# Patient Record
Sex: Female | Born: 1940 | Race: White | Hispanic: No | Smoking: Never smoker
Health system: Southern US, Community
[De-identification: ages and names within clinical notes are randomized; demographics above are authoritative.]

## PROBLEM LIST (undated history)

## (undated) DIAGNOSIS — M1990 Unspecified osteoarthritis, unspecified site: Secondary | ICD-10-CM

## (undated) DIAGNOSIS — K3184 Gastroparesis: Secondary | ICD-10-CM

## (undated) DIAGNOSIS — Z90710 Acquired absence of both cervix and uterus: Secondary | ICD-10-CM

## (undated) DIAGNOSIS — G4730 Sleep apnea, unspecified: Secondary | ICD-10-CM

## (undated) DIAGNOSIS — K449 Diaphragmatic hernia without obstruction or gangrene: Secondary | ICD-10-CM

## (undated) DIAGNOSIS — E039 Hypothyroidism, unspecified: Secondary | ICD-10-CM

## (undated) DIAGNOSIS — R0789 Other chest pain: Secondary | ICD-10-CM

## (undated) DIAGNOSIS — Z967 Presence of other bone and tendon implants: Secondary | ICD-10-CM

## (undated) DIAGNOSIS — M542 Cervicalgia: Secondary | ICD-10-CM

## (undated) DIAGNOSIS — IMO0002 Reserved for concepts with insufficient information to code with codable children: Secondary | ICD-10-CM

## (undated) DIAGNOSIS — Z7289 Other problems related to lifestyle: Secondary | ICD-10-CM

## (undated) DIAGNOSIS — K219 Gastro-esophageal reflux disease without esophagitis: Secondary | ICD-10-CM

## (undated) DIAGNOSIS — M549 Dorsalgia, unspecified: Secondary | ICD-10-CM

## (undated) DIAGNOSIS — F329 Major depressive disorder, single episode, unspecified: Secondary | ICD-10-CM

## (undated) DIAGNOSIS — F32A Depression, unspecified: Secondary | ICD-10-CM

## (undated) HISTORY — DX: Gastroparesis: K31.84

## (undated) HISTORY — PX: COLONOSCOPY W/ BIOPSIES AND POLYPECTOMY: SHX1376

## (undated) HISTORY — DX: Major depressive disorder, single episode, unspecified: F32.9

## (undated) HISTORY — PX: NECK SURGERY: SHX720

## (undated) HISTORY — DX: Diaphragmatic hernia without obstruction or gangrene: K44.9

## (undated) HISTORY — DX: Cervicalgia: M54.2

## (undated) HISTORY — DX: Reserved for concepts with insufficient information to code with codable children: IMO0002

## (undated) HISTORY — DX: Other chest pain: R07.89

## (undated) HISTORY — DX: Depression, unspecified: F32.A

## (undated) HISTORY — DX: Gastro-esophageal reflux disease without esophagitis: K21.9

## (undated) HISTORY — DX: Acquired absence of both cervix and uterus: Z90.710

## (undated) HISTORY — DX: Sleep apnea, unspecified: G47.30

## (undated) HISTORY — DX: Other problems related to lifestyle: Z72.89

## (undated) HISTORY — DX: Hypothyroidism, unspecified: E03.9

## (undated) HISTORY — DX: Presence of other bone and tendon implants: Z96.7

## (undated) HISTORY — PX: ABDOMINAL HYSTERECTOMY: SHX81

## (undated) HISTORY — DX: Dorsalgia, unspecified: M54.9

---

## 1998-04-17 ENCOUNTER — Ambulatory Visit (HOSPITAL_COMMUNITY): Admission: RE | Admit: 1998-04-17 | Discharge: 1998-04-17 | Payer: Self-pay | Admitting: Family Medicine

## 1999-03-03 ENCOUNTER — Other Ambulatory Visit: Admission: RE | Admit: 1999-03-03 | Discharge: 1999-03-03 | Payer: Self-pay | Admitting: Obstetrics and Gynecology

## 2000-04-10 ENCOUNTER — Encounter: Admission: RE | Admit: 2000-04-10 | Discharge: 2000-04-10 | Payer: Self-pay | Admitting: Obstetrics and Gynecology

## 2000-04-10 ENCOUNTER — Encounter: Payer: Self-pay | Admitting: Obstetrics and Gynecology

## 2000-05-02 ENCOUNTER — Other Ambulatory Visit: Admission: RE | Admit: 2000-05-02 | Discharge: 2000-05-02 | Payer: Self-pay | Admitting: *Deleted

## 2000-12-04 ENCOUNTER — Ambulatory Visit (HOSPITAL_COMMUNITY): Admission: RE | Admit: 2000-12-04 | Discharge: 2000-12-04 | Payer: Self-pay | Admitting: Gastroenterology

## 2002-01-24 ENCOUNTER — Other Ambulatory Visit: Admission: RE | Admit: 2002-01-24 | Discharge: 2002-01-24 | Payer: Self-pay | Admitting: Obstetrics and Gynecology

## 2002-02-09 ENCOUNTER — Emergency Department (HOSPITAL_COMMUNITY): Admission: EM | Admit: 2002-02-09 | Discharge: 2002-02-09 | Payer: Self-pay | Admitting: Emergency Medicine

## 2002-04-02 ENCOUNTER — Encounter: Admission: RE | Admit: 2002-04-02 | Discharge: 2002-04-02 | Payer: Self-pay | Admitting: Obstetrics and Gynecology

## 2002-04-02 ENCOUNTER — Encounter: Payer: Self-pay | Admitting: Obstetrics and Gynecology

## 2003-01-18 ENCOUNTER — Encounter: Payer: Self-pay | Admitting: Neurosurgery

## 2003-01-18 ENCOUNTER — Ambulatory Visit (HOSPITAL_COMMUNITY): Admission: RE | Admit: 2003-01-18 | Discharge: 2003-01-18 | Payer: Self-pay | Admitting: Neurosurgery

## 2003-01-28 ENCOUNTER — Other Ambulatory Visit: Admission: RE | Admit: 2003-01-28 | Discharge: 2003-01-28 | Payer: Self-pay | Admitting: Obstetrics and Gynecology

## 2003-03-05 ENCOUNTER — Encounter: Payer: Self-pay | Admitting: Neurosurgery

## 2003-03-07 ENCOUNTER — Inpatient Hospital Stay (HOSPITAL_COMMUNITY): Admission: RE | Admit: 2003-03-07 | Discharge: 2003-03-08 | Payer: Self-pay | Admitting: Neurosurgery

## 2003-03-07 ENCOUNTER — Encounter: Payer: Self-pay | Admitting: Neurosurgery

## 2003-04-26 ENCOUNTER — Emergency Department (HOSPITAL_COMMUNITY): Admission: EM | Admit: 2003-04-26 | Discharge: 2003-04-26 | Payer: Self-pay | Admitting: Emergency Medicine

## 2003-05-26 ENCOUNTER — Encounter: Payer: Self-pay | Admitting: Neurosurgery

## 2003-05-26 ENCOUNTER — Encounter: Admission: RE | Admit: 2003-05-26 | Discharge: 2003-05-26 | Payer: Self-pay | Admitting: Neurosurgery

## 2003-07-07 ENCOUNTER — Ambulatory Visit (HOSPITAL_BASED_OUTPATIENT_CLINIC_OR_DEPARTMENT_OTHER): Admission: RE | Admit: 2003-07-07 | Discharge: 2003-07-07 | Payer: Self-pay | Admitting: Family Medicine

## 2004-02-20 ENCOUNTER — Encounter: Admission: RE | Admit: 2004-02-20 | Discharge: 2004-02-20 | Payer: Self-pay | Admitting: Obstetrics and Gynecology

## 2004-10-22 ENCOUNTER — Emergency Department (HOSPITAL_COMMUNITY): Admission: EM | Admit: 2004-10-22 | Discharge: 2004-10-22 | Payer: Self-pay | Admitting: Emergency Medicine

## 2004-10-25 ENCOUNTER — Ambulatory Visit (HOSPITAL_COMMUNITY): Admission: RE | Admit: 2004-10-25 | Discharge: 2004-10-25 | Payer: Self-pay | Admitting: Neurosurgery

## 2004-12-16 ENCOUNTER — Ambulatory Visit: Admission: RE | Admit: 2004-12-16 | Discharge: 2004-12-16 | Payer: Self-pay | Admitting: Family Medicine

## 2005-07-22 ENCOUNTER — Ambulatory Visit (HOSPITAL_COMMUNITY): Admission: RE | Admit: 2005-07-22 | Discharge: 2005-07-22 | Payer: Self-pay | Admitting: Family Medicine

## 2005-07-30 ENCOUNTER — Emergency Department (HOSPITAL_COMMUNITY): Admission: EM | Admit: 2005-07-30 | Discharge: 2005-07-30 | Payer: Self-pay | Admitting: Emergency Medicine

## 2005-09-08 ENCOUNTER — Ambulatory Visit (HOSPITAL_BASED_OUTPATIENT_CLINIC_OR_DEPARTMENT_OTHER): Admission: RE | Admit: 2005-09-08 | Discharge: 2005-09-08 | Payer: Self-pay | Admitting: Family Medicine

## 2005-09-10 ENCOUNTER — Encounter: Payer: Self-pay | Admitting: Emergency Medicine

## 2005-09-10 ENCOUNTER — Inpatient Hospital Stay (HOSPITAL_COMMUNITY): Admission: AD | Admit: 2005-09-10 | Discharge: 2005-09-14 | Payer: Self-pay | Admitting: Internal Medicine

## 2005-09-11 ENCOUNTER — Ambulatory Visit: Payer: Self-pay | Admitting: Internal Medicine

## 2005-09-12 ENCOUNTER — Encounter (INDEPENDENT_AMBULATORY_CARE_PROVIDER_SITE_OTHER): Payer: Self-pay | Admitting: Cardiology

## 2005-10-10 ENCOUNTER — Ambulatory Visit: Payer: Self-pay | Admitting: Gastroenterology

## 2005-11-09 ENCOUNTER — Ambulatory Visit: Payer: Self-pay | Admitting: Gastroenterology

## 2005-11-13 ENCOUNTER — Emergency Department (HOSPITAL_COMMUNITY): Admission: EM | Admit: 2005-11-13 | Discharge: 2005-11-13 | Payer: Self-pay | Admitting: *Deleted

## 2005-11-16 ENCOUNTER — Encounter: Admission: RE | Admit: 2005-11-16 | Discharge: 2005-11-16 | Payer: Self-pay | Admitting: Sports Medicine

## 2005-11-25 ENCOUNTER — Ambulatory Visit (HOSPITAL_COMMUNITY): Admission: RE | Admit: 2005-11-25 | Discharge: 2005-11-25 | Payer: Self-pay | Admitting: Unknown Physician Specialty

## 2006-02-04 IMAGING — RF DG UGI W/ HIGH DENSITY W/KUB
13 of 24 series · 13 of 24 positions shown · non-contrast
Comparison: none

CLINICAL DATA: Dysphagia. Pain in the epigastric region and lower chest, at the
level of the gastroesophageal junction. Atrophic gastritis at recent endoscopy.
Long-term Nexium use.

UPPER GI SERIES W/ HIGH-DENSITY BARIUM AND KUB:
TECHNIQUE: After obtaining a scout radiograph, a double-contrast upper GI
series was performed using both high-density and thin barium.

[Series 1: run · 1 of 1 slices shown (1 of 13)]
[im 1/1]
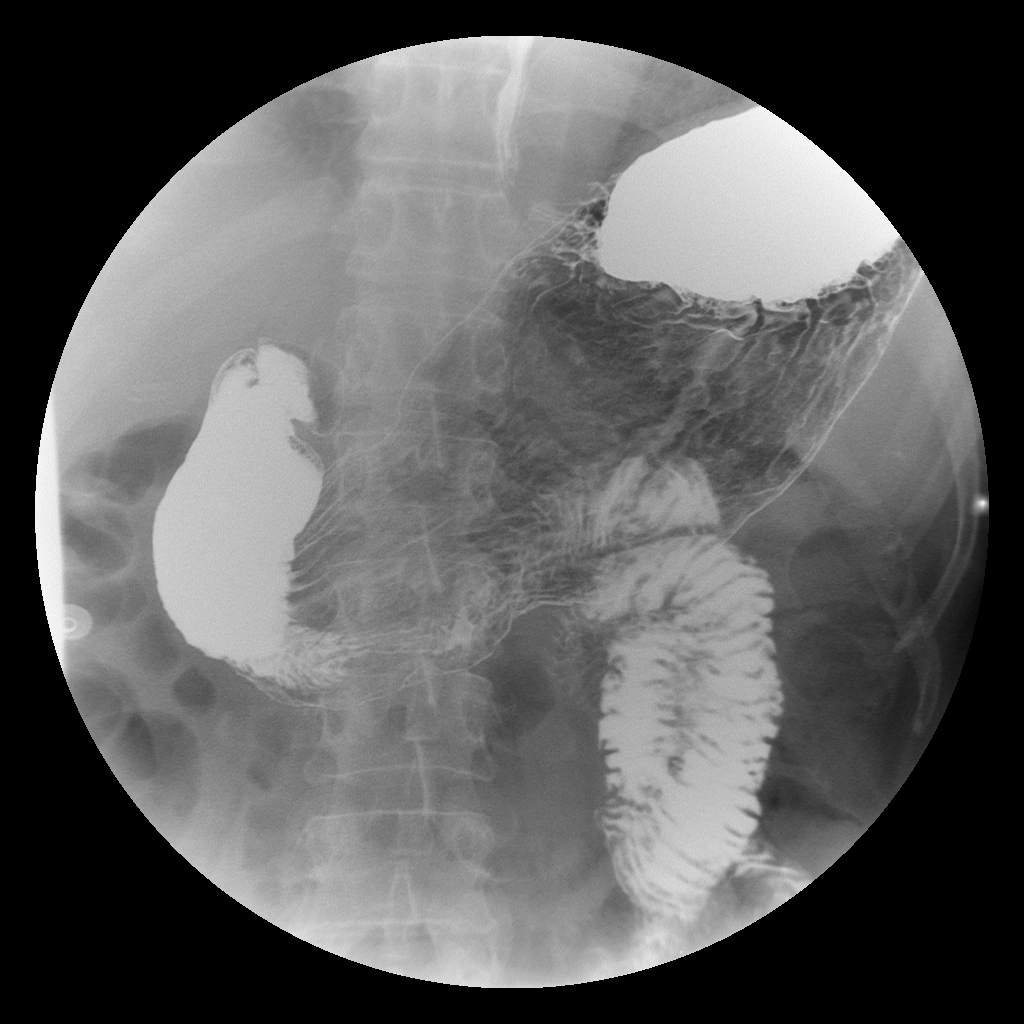

[Series 3: run · 1 of 1 slices shown (2 of 13)]
[im 1/1]
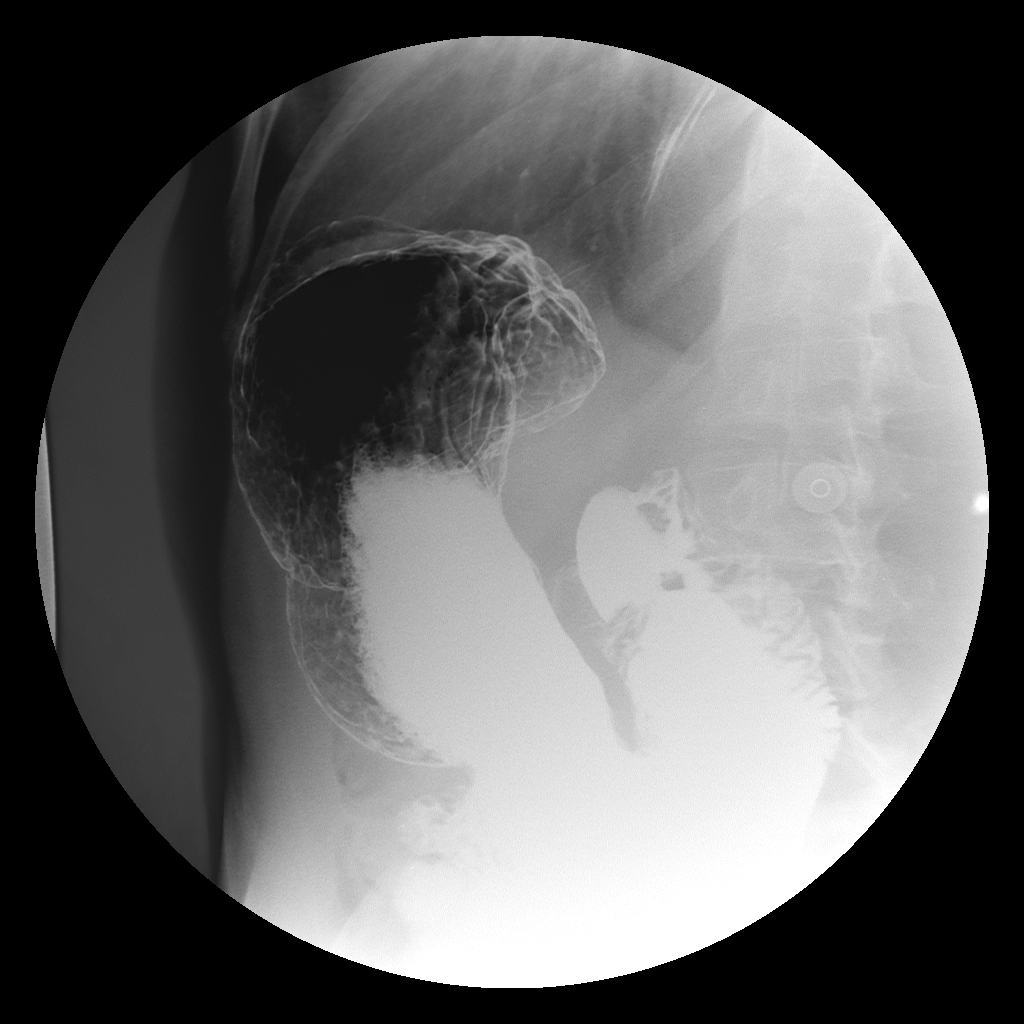

[Series 5: run · 1 of 1 slices shown (3 of 13)]
[im 1/1]
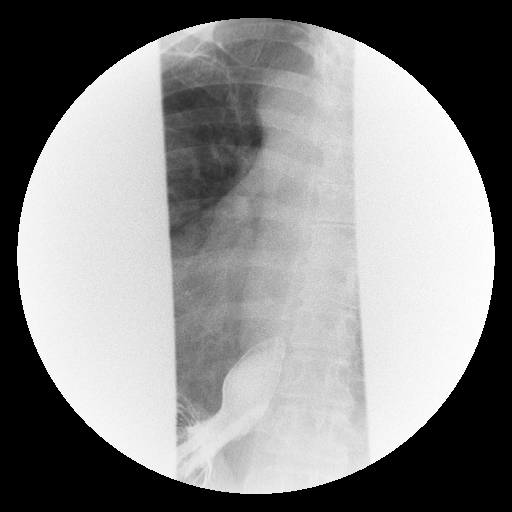

[Series 7: run · 1 of 1 slices shown (4 of 13)]
[im 1/1]
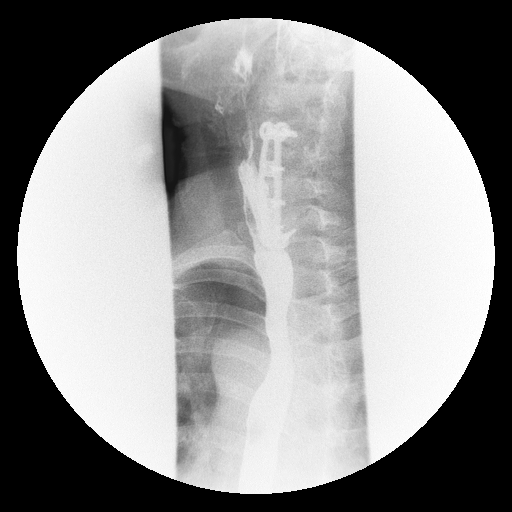

[Series 9: run · 1 of 1 slices shown (5 of 13)]
[im 1/1]
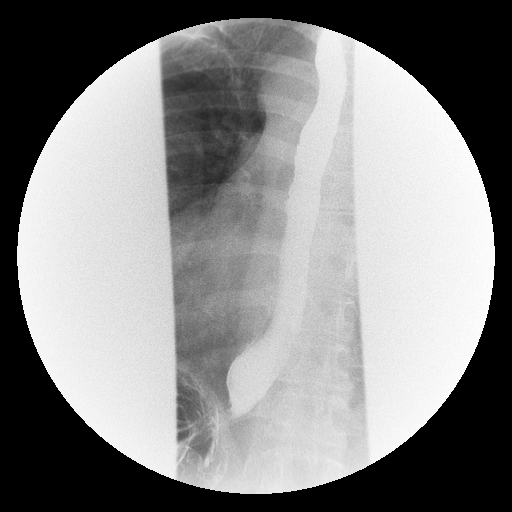

[Series 11: run · 1 of 1 slices shown (6 of 13)]
[im 1/1]
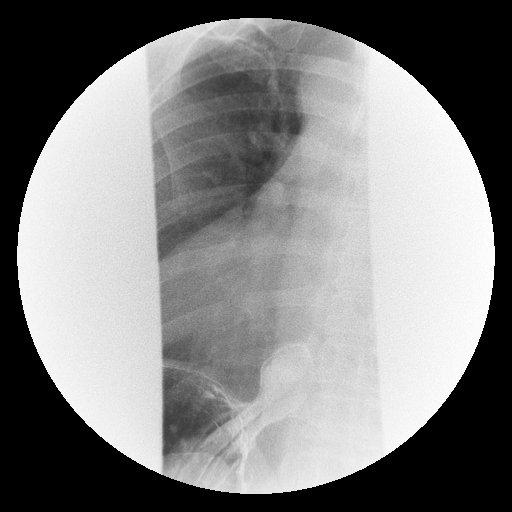

[Series 13: run · 1 of 1 slices shown (7 of 13)]
[im 1/1]
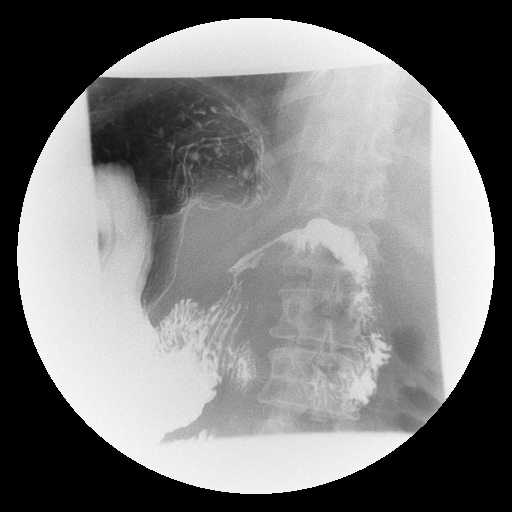

[Series 14: run · 1 of 1 slices shown (8 of 13)]
[im 1/1]
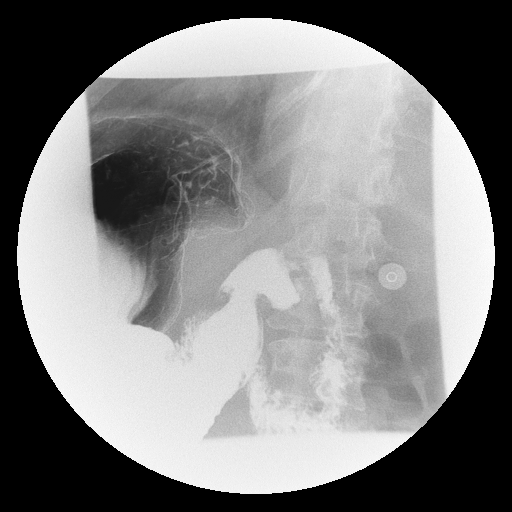

[Series 16: run · 1 of 1 slices shown (9 of 13)]
[im 1/1]
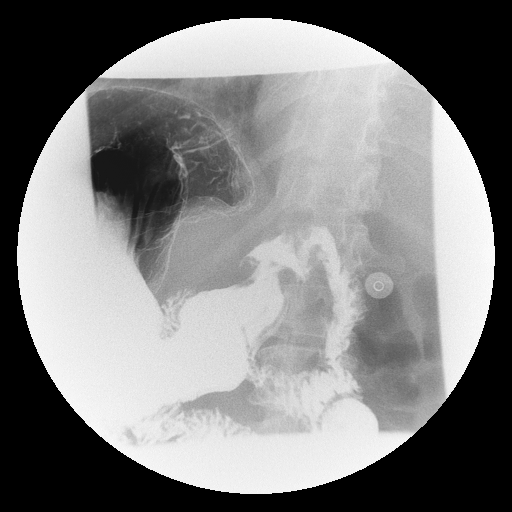

[Series 18: run · 1 of 1 slices shown (10 of 13)]
[im 1/1]
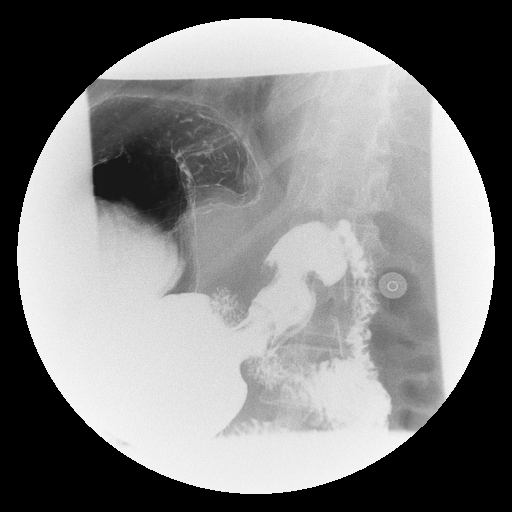

[Series 20: run · 1 of 1 slices shown (11 of 13)]
[im 1/1]
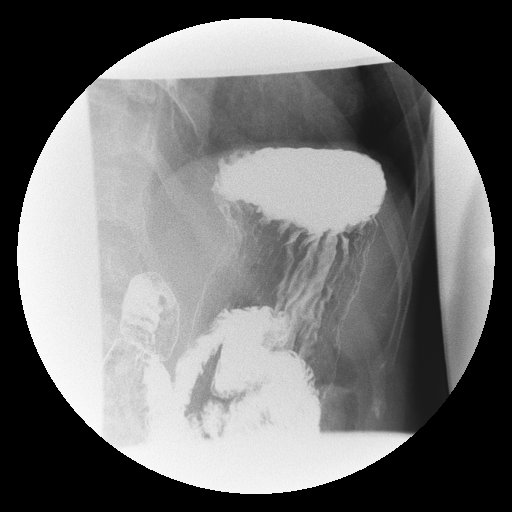

[Series 22: run · 1 of 1 slices shown (12 of 13)]
[im 1/1]
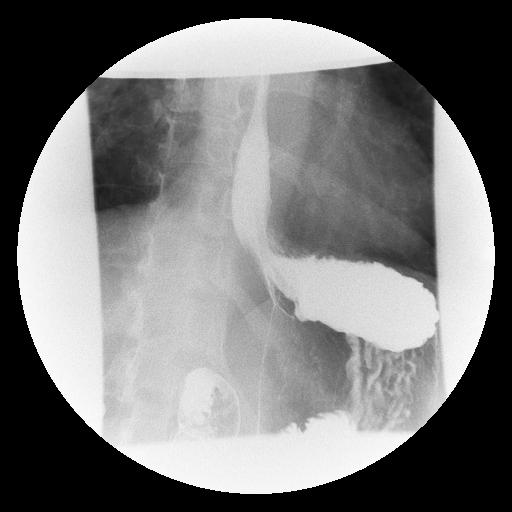

[Series 24: run · 1 of 1 slices shown (13 of 13)]
[im 1/1]
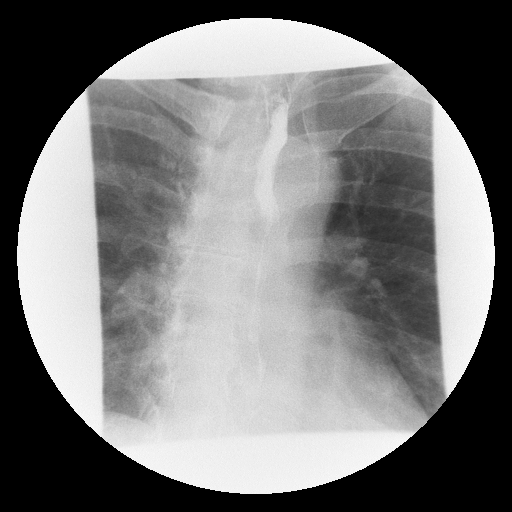

[13 of 24 positions shown; findings below may reference images not displayed]

FINDINGS: The preliminary supine radiograph of the abdomen demonstrates a
normal bowel gas pattern and mild dextro-convex thoracolumbar rotary scoliosis.
Also noted are bilateral pelvic phleboliths and lower lumbar spine facet
degenerative changes.

The patient swallowed barium without difficulty. Normal primary and secondary
esophageal peristalsis with minimal intermittent tertiary peristaltic activity
in the midesophagus. Small sliding hiatal hernia. Otherwise, normal appearing
esophagus, stomach, duodenal bulb and proximal small bowel. Spontaneous
gastroesophageal reflux to the level of the clavicles with slow clearing of the
esophagus following reflux. No strictures, masses or ulcerations seen. No
hypopharyngeal abnormalities demonstrated. Screw and plate fixation of the
cervical spine is noted. The patient swallowed a 12.5 mm in diameter barium
tablet without difficulty. This passed normally through the esophagus and into
the stomach.
IMPRESSION: 1. Small sliding hiatal hernia.

2. Spontaneous gastroesophageal reflux to the level of the clavicles with slow
clearing of the esophagus following reflux.

3. Minimal intermittent tertiary peristaltic activity in the midesophagus.

## 2006-04-18 ENCOUNTER — Inpatient Hospital Stay (HOSPITAL_COMMUNITY): Admission: AD | Admit: 2006-04-18 | Discharge: 2006-04-24 | Payer: Self-pay | Admitting: *Deleted

## 2006-04-18 ENCOUNTER — Ambulatory Visit: Payer: Self-pay | Admitting: *Deleted

## 2006-05-11 ENCOUNTER — Encounter: Admission: RE | Admit: 2006-05-11 | Discharge: 2006-05-11 | Payer: Self-pay | Admitting: Sports Medicine

## 2006-06-05 ENCOUNTER — Encounter: Admission: RE | Admit: 2006-06-05 | Discharge: 2006-06-05 | Payer: Self-pay | Admitting: Sports Medicine

## 2007-06-12 DIAGNOSIS — K2100 Gastro-esophageal reflux disease with esophagitis, without bleeding: Secondary | ICD-10-CM | POA: Insufficient documentation

## 2007-08-17 ENCOUNTER — Encounter: Admission: RE | Admit: 2007-08-17 | Discharge: 2007-08-17 | Payer: Self-pay | Admitting: Family Medicine

## 2007-09-03 ENCOUNTER — Encounter: Admission: RE | Admit: 2007-09-03 | Discharge: 2007-09-03 | Payer: Self-pay | Admitting: Family Medicine

## 2008-08-12 DIAGNOSIS — E039 Hypothyroidism, unspecified: Secondary | ICD-10-CM | POA: Insufficient documentation

## 2008-08-27 ENCOUNTER — Ambulatory Visit: Payer: Self-pay | Admitting: Vascular Surgery

## 2010-09-19 ENCOUNTER — Encounter: Payer: Self-pay | Admitting: Family Medicine

## 2010-09-19 ENCOUNTER — Encounter: Payer: Self-pay | Admitting: *Deleted

## 2010-09-19 ENCOUNTER — Encounter: Payer: Self-pay | Admitting: Sports Medicine

## 2011-01-11 NOTE — Procedures (Signed)
CAROTID DUPLEX EXAM   INDICATION:  Left carotid bruit.   HISTORY:  Diabetes:  No.  Cardiac:  No.  Hypertension:  No.  Smoking:  No.  Previous Surgery:  No.  CV History:  Asymptomatic.  Amaurosis Fugax No, Paresthesias No, Hemiparesis No                                       RIGHT             LEFT  Brachial systolic pressure:         126               128  Brachial Doppler waveforms:         Normal            Normal  Vertebral direction of flow:        Antegrade         Antegrade  DUPLEX VELOCITIES (cm/sec)  CCA peak systolic                   100               86  ECA peak systolic                   65                86  ICA peak systolic                   88                50  ICA end diastolic                   38                21  PLAQUE MORPHOLOGY:                  Heterogenous  PLAQUE AMOUNT:                      Minimal           None  PLAQUE LOCATION:                    ICA/ECA   IMPRESSION:  1. 1-39% stenosis of the right internal carotid artery.  2. No evidence of stenosis noted in the left carotid arteries.  3. Preliminary report was faxed to Dr. Elvis Coil office of 08/27/2008.   ___________________________________________  V. Charlena Cross, MD   CH/MEDQ  D:  08/27/2008  T:  08/27/2008  Job:  [id number]

## 2011-01-14 NOTE — Op Note (Signed)
NAME:  [name], [name]                          ACCOUNT NO.:  [id number]   MEDICAL RECORD NO.:  [id number]                   PATIENT TYPE:  INP   LOCATION:  3005                                 FACILITY:  [facility]   PHYSICIAN:  Payton Doughty, M.D.                   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   OPERATIVE PROCEDURE:  C4-C5, C5-C6, C6-C7 anterior cervical decompression  and fusion with tether plate.   SURGEON:  Payton Doughty, M.D.   ANESTHESIA:  General endotracheal anesthesia.   PREPARATION:  Betadine and alcohol wipe.   COMPLICATIONS:  None.   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  She is taken to the operating room,  general anesthesia administered and intubated, and placed supine on the  operating table.  Following shave, prep, and drape in the usual sterile  fashion, the skin was incised starting 3 fingerbreadths below the carotid  tubercle on the sternocleidomastoid muscle, paralleling the  sternocleidomastoid muscle, cephalad for a distance of approximately 8 cm.  The platysma was identified, elevated, divided, and undermined.  The  sternocleidomastoid muscle was identified.  Medial dissection revealed the  carotid artery retracted laterally to the left, the trachea and esophagus  retracted laterally to the right exposing the bones of the anterior cervical  spine.  A marker was placed and interoperative x-ray was obtained to confirm  correct level.  After confirming correct level, the longus colli muscle was  taken down at C4-C5, C5-C6, and C6-C7 and the self-retaining retractor  placed.   Discectomy  was then carried out under gross observation at C4-C5, C5-C6, and  C6-C7.  The operating microscope was then brought in and we used  microdissection technique to complete the decompression discectomy removing  the posterior longitudinal ligament, explore the neural foramina  bilaterally.  At C4-C5 and C6-C7, there was extensive degenerative change  with prominent osteophytic spurring.  At C5-C6, there is osteophytic  spurring but also a central disc and slightly to the right.  This was  removed without difficulty.  The neural foramen were carefully explored at  all levels and found to be open.  Where there was not an opening, it was  enlarged with a Kerrison punch.  Having achieved decompression, 7 mm bone  grafts were fashioned from patellar allograft and tapped into place.  A 52  mm tethered plate was then placed with two screws in C4, two in C7, one  respectively in C5 and C6.  The screws were all 12 mm.  The  interoperative x-  ray showed good placement of bone grafts, plate and screws.  The wound was  irrigated and hemostasis assured.  The platysma was reapproximated with 2-0  Vicryl in an interrupted fashion, the  subcutaneous tissue reapproximated with 3-0 Vicryl in an interrupted  fashion, and the skin was closed with 4-0 Vicryl in a running subcuticular  fashion.  Benzoin and Steri-Strips were placed with Telfa and OpSite.  The  patient was placed in an Aspen collar and returned to the recovery room in  good condition.                                               Payton Doughty, M.D.    MWR/MEDQ  D:  03/07/2003  T:  03/07/2003  Job:  [id number]

## 2011-01-14 NOTE — Consult Note (Signed)
[name], [name]                ACCOUNT NO.:  [id number]   MEDICAL RECORD NO.:  [id number]          PATIENT TYPE:  INP   LOCATION:  2009                         FACILITY:  [facility]   PHYSICIAN:  Georga Hacking, M.D.DATE OF BIRTH:  [birth year]   DATE OF CONSULTATION:  DATE OF DISCHARGE:                                   CONSULTATION   HISTORY OF PRESENT ILLNESS:  Thank you for asking me to see this 70 year old  female for evaluation of chest discomfort, chest pressure, PND, throat  tightness and numbness.  The patient is a 70 year old female who has  previously been in good health.  She has a 2-year history of sleep apnea and  also had cervical disk disease requiring a multi-level laminectomy at that  time.  She had hypothyroidism and has had some fatigue.  She has a history  of severe reflux previously.  Around Thanksgiving in November she had an  upper respiratory infection treated with antibiotics and steroids.  She had  cough during that time but since that time has had a feeling of chest  pressure and squeezing-type discomfort.  She has had symptoms though due to  squeezing related to reflux, feels this is different.  She has had a  constant pressure in her mid-epigastric area and mid-sternal area for 2  weeks.  It would be worse when she lay down, and also in addition has had a  sensation of her throat closing and feeling as if somebody had a band around  her throat.  She has been unable to sleep at night and has seen Dr. Tiburcio Pea  for this.  In addition, she has had numbness around her face and has had  numbness in both of her legs she has also seen Dr. Tiburcio Pea for.  She had a  sleep study on Thursday night and had an event noted there where she had  some squeezing pressure and felt as if she might stop breathing.  She has  been awakened from sleep with pressure and squeezing and has had no relief  despite titration of Zantac to Prilosec and __________.  She has complained  of  feeling excessively fatigued.  She has not had palpitations or syncope.  She had some transient blurred vision earlier today and was seen at the  hospital, where an EKG was unremarkable.   LABORATORY DATA:  Clearly much unremarkable.   PAST MEDICAL HISTORY:  Remarkable for a previous history of hypothyroidism,  sleep apnea, and severe reflux.  There is no history of diabetes,  hyperlipidemia, or hypertension.  Previous surgery:  Hysterectomy and multi-  level cervical laminectomy.   ALLERGIES:  PENICILLIN.   CURRENT MEDICATIONS:  Zegerid one daily, Omothyroid daily, Estrace.   FAMILY HISTORY:  Mother died of Alzheimer's disease at age 74, father died  of old age at age 58.  She has 2 sisters who are in good health and a  brother who is healthy.   SOCIAL HISTORY:  She has been divorced.  She has a daughter that she  currently lives with.  She attends church at [employer].  She is  a nonsmoker and does not use alcohol to excess.   REVIEW OF SYSTEMS:  Notes an 8-pound weight loss and excessive fatigue.  She  has been told that she has early glaucoma and may have early cataracts.  She  has no difficulty hearing.  She has no difficulty with her sinuses or her  mouth.  She has significant reflux but has never been checked for  gallbladder disease, no constipation or diarrhea.  She has no kidney stones,  urinary tract infection or urinary complaints.  She thinks she has some  early arthritis in her hip.  She has dry skin.  She has a previous history  of hypothyroidism.  She has chronic low back pain and also may have  occasional headaches.  No history of CVA or TIA.  Other than as noted above,  the remainder of the review of systems is unremarkable.   PHYSICAL EXAMINATION:  On examination she is a pleasant female, appearing  stated age.  Blood pressure is currently 115/79, pulse 60 and regular.  Respirations were 12.  She is pleasant and in no acute distress.  Her skin  is  warm and dry with no obvious mass or lesions.  ENT:  She wears glasses.  EOMI, PERRL.  Sclerae are clear.  Fundi not  examined.  Pharynx negative.  NECK:  Supple, without masses.  There is no JVD, thyromegaly, or bruits.  Her neck is fused.  LUNGS:  Clear bilaterally.  CARDIOVASCULAR:  Normal S1, S2.  There is no S3, S4 or murmur.  ABDOMEN:  Soft and nontender.  There is no right upper quadrant tenderness  and no rebound.  EXTREMITIES:  Her femoral and distal pulses are 2+.  There is no peripheral  edema noted.  There is no Homan's sign noted.  GU and RECTAL were deferred due to cardiac condition.  NEUROLOGIC:  Grossly normal.   Her 12-lead EKG shows normal sinus rhythm.  There is voltage criteria for  LDH.  Chest x-ray shows clear lung fields.  Heart size is normal.  The ascending  aorta appears somewhat prominent.   LABORATORY DATA:  Normal CBC and normal chemistry panel.  Initial point of  care enzymes were completely normal.  Liver enzymes were normal.   IMPRESSION:  1.  Vague subacute history of chest pressure tightness, throat tightness,      which has atypical features to it.  She has had significant chest      squeezing and pressure occurring more at rest than with exertion.  It      could be an atypical presentation of angina or could be non-cardiac.      She had an emergency room visit in December also for similar complaints.  2.  History of severe reflux, unresponsive to current medicines.  3.  History of hypothyroidism under treatment.  4.  History of cervical laminectomy.  5.  Perioral numbness and leg numbness of uncertain cause.  6.  Fatigue and malaise of uncertain cause.  7.  History of sleep apnea treated with C-PAP.   RECOMMENDATIONS:  Very difficult history to sort out what this is.  She has  had 2 emergency room visits within the past month.  She is admitted to a telemetry bed and will be placed on anticoagulation, low-dose beta blockers  and serial enzymes  will be obtained.  Initial enzymes are negative.  An EKG  is normal.   I would recommend that she have  a gallbladder ultrasound test and if this is  negative it may be that a cardiac catheterization would best help sort out  the symptoms that she is having.  She has had 2 emergency room visits and  her pain is at rest, although she does not have any significant risk factors  for cardiac disease.  I will follow along with you and make final  recommendations as to the source of diagnostic testing.      Georga Hacking, M.D.  Electronically Signed     WST/MEDQ  D:  09/10/2005  T:  09/11/2005  Job:  [id number]   cc:   Melissa L. Ladona Ridgel, MD   Holley Bouche, M.D.  Fax: [phone]

## 2011-01-14 NOTE — H&P (Signed)
NAME:  [name], [name]                          ACCOUNT NO.:  [id number]   MEDICAL RECORD NO.:  [id number]                   PATIENT TYPE:  INP   LOCATION:  3005                                 FACILITY:  [facility]   PHYSICIAN:  Payton Doughty, M.D.                   DATE OF BIRTH:  1941-04-27   DATE OF ADMISSION:  03/07/2003  DATE OF DISCHARGE:                                HISTORY & PHYSICAL   ADMISSION DIAGNOSIS:  Cervical spondylosis at C4-5, C5-6, C6-7.   HISTORY OF PRESENT ILLNESS:  A very nice 70 year old right-handed white lady  with an injury to her ______ when she fell roller skating. Visited with Dr.  Elesa Hacker, had some C4-5 disease.  She visits with a chiropractor.  I saw her  almost a year ago with neck pain, and she had spondylosis at that time,  underwent epidural steroids which did not seem to help her very much.  She  has had progression of spondylitic disease at 4-5, 5-6, and 6-7, and is now  admitted for anterior cervical diskectomy and fusion.   PAST MEDICAL HISTORY:  Remarkable for hypothyroidism   MEDICATIONS:  Paxil and estrogen replacement, Armor thyroid, Vicodin,  Robaxin, and Celebrex.   ALLERGIES:  PENICILLIN.   PAST SURGICAL HISTORY:  Hysterectomy.   FAMILY HISTORY:  Mom died at 85 with Alzheimer's disease.  Dad died at 66  with hypertension and glaucoma.   SOCIAL HISTORY:  She does smoke or drink.  Retired Management consultant.   REVIEW OF SYSTEMS:  Remarkable for neck pain, occasional difficulty  breathing, glasses, and hormone difficulties.   PHYSICAL EXAMINATION:  HEENT:  Within normal limits.  NECK:  She has reasonable range of motion of the neck.  CHEST:  Clear.  CARDIAC:  Regular rate and rhythm without murmur.  ABDOMEN:  Nontender without hepatosplenomegaly.  EXTREMITIES:  Without clubbing or cyanosis.  GU:  Exam deferred.  PULSES:  Peripheral pulses are good.  NEUROLOGIC:  She is awake, alert, and oriented.  Cranial nerves are intact.  Motor exam  shows 5/5 strength throughout the upper extremities save for  bilateral weakness of the triceps at 5-/5.  She has radicular pain with head  motion. Sensory deficit described in C6 and C7 distribution.  Reflexes are 1  at the biceps, absent at the triceps, 1 at the brachialis.  She has mild and  positive Hoffmann's on the right side.   LABORATORY DATA:  MRI demonstrates spondylitic disease at 4-5, 5-6, and 6-7,  replacement of cord and new disk at 5-6.    PLAN:  Anterior cervicectomy and fusion at C4-5, C5-6, and C6-7.  The risks  and benefits of this approach have been discussed with her, and she wishes  to proceed.  Payton Doughty, M.D.    MWR/MEDQ  D:  03/07/2003  T:  03/07/2003  Job:  [id number]

## 2011-01-14 NOTE — Consult Note (Signed)
NAMEJAYCI, ELLEFSON                ACCOUNT NO.:  [id number]   MEDICAL RECORD NO.:  [id number]          PATIENT TYPE:  INP   LOCATION:  2009                         FACILITY:  [facility]   PHYSICIAN:  Shirley Friar, MDDATE OF BIRTH:  [birth year]   DATE OF CONSULTATION:  09/13/2005  DATE OF DISCHARGE:                                   CONSULTATION   REASON FOR CONSULT:  Dysphagia.   HISTORY OF PRESENT ILLNESS:  This consult was completed at the request of  Dr. Jasmine Pang in regards to the patient's dysphagia and chest pain. The  patient is a 70 year old white female who was admitted on September 10, 2005,  secondary to substernal chest pain. She ruled out for MI and underwent a  cardiac catheterization on September 12, 2005, which was normal. She reports  having chest pain daily for the past several weeks that worsened last week  with associated neck pressure. She states that the neck pressure and chest  pain always occur simultaneously and she cannot pinpoint anything that makes  it better. She note that lying down makes it worse. She has associated  shortness of breath, diaphoresis, and has felt like I was going to die.  She denies any palpitations. She denies any problems swallowing her food.  Does report having a choking feeling when she is not eating when this chest  pain is present as well. She denies any associated abdominal pain. She does  report heartburn chronically. She last saw my partner, Dr. Randa Evens, on  September 02, 2005, who gave her a trial of Zegerid for her reflux. She says  the Zegerid did not help at all with her problems and reports being on  Prevacid in the past without any significant relief. She had an upper  endoscopy done in June 2005 which showed erosive esophagitis and a small  hiatal hernia but otherwise was negative.   PAST MEDICAL HISTORY:  1.  History of GERD.  2.  Hypothyroidism.  3.  History of hiatal hernia.  4.  Sleep apnea.  5.  Status post  hysterectomy.  6.  Status post cervical discectomy.   FAMILY HISTORY:  Noncontributory.   SOCIAL HISTORY:  Denies tobacco or alcohol.   CURRENT MEDICINES:  1.  Xanax 1 mg q.h.s.  2.  Lovenox 40 mg subcu daily.  3.  Pepcid 20 mg b.i.d.  4.  Prilosec 40 mg daily.  5.  Armor Thyroid 30 mg daily.   ALLERGIES:  PENICILLIN and CELEBREX.   REVIEW OF SYSTEMS:  As stated in the HPI.   PHYSICAL EXAMINATION:  VITAL SIGNS:  Temperature 97.7, pulse 60, blood  pressure 117/58, O2 saturation 96% on room air.  GENERAL:  Alert, in no acute distress.  HEENT:  Nonicteric sclerae, oropharynx clear.  CHEST:  Clear to auscultation bilaterally.  CARDIOVASCULAR:  Regular rhythm without murmurs.  ABDOMEN:  Soft, nontender, nondistended, active bowel sounds.  EXTREMITIES:  No edema.   ASSESSMENT:  A 70 year old white female with gastroesophageal reflux disease  and history of hiatal hernia presenting with chest pressure and neck  pressure. My  concern is that she could have esophageal dysmotility along  with a larger hiatal hernia versus worsened esophageal reflux as the source  of her symptoms. Will plan to do upper endoscopy today to further evaluate  for ulcerative disease. She had a gastric emptying study done today and the  results are pending. She may benefit from a trial of Reglan versus Elavil  but await these two studies prior to ordering this medicine. She should  continue on proton pump inhibitor daily.      Shirley Friar, MD  Electronically Signed     VCS/MEDQ  D:  09/13/2005  T:  09/13/2005  Job:  [id number]   cc:   Fayrene Fearing L. Malon Kindle., M.D.  Fax: [phone]   Theone Stanley, MD

## 2011-01-14 NOTE — Discharge Summary (Signed)
[name], [name]                ACCOUNT NO.:  [id number]   MEDICAL RECORD NO.:  [id number]          PATIENT TYPE:  IPS   LOCATION:  0302                          FACILITY:  [facility]   PHYSICIAN:  Jasmine Pang, M.D. DATE OF BIRTH:  02-Jul-1941   DATE OF ADMISSION:  04/18/2006  DATE OF DISCHARGE:  04/24/2006                                 DISCHARGE SUMMARY   IDENTIFYING INFORMATION:  This is a 70 year old divorced Caucasian female  who was admitted on a voluntary basis on April 18, 2006.   HISTORY OF PRESENT ILLNESS:  The patient presents with a history of self-  inflicted injury.  She cut her right wrist superficially with a razor blade  a few days prior to admission.  She states that her intention was to die.  She reports a history of chronic pain.  She denies any recent stressors or  changes in her life.  The patient states that she stopped driving one month  ago.  She states she had been losing weight but denies any change in her  appetite.  She has not been sleeping well.  She denies hallucinations or  anxiety and paranoid ideation and feels safe in this facility.   PAST PSYCHIATRIC HISTORY:  This is the patient's first psychiatric  admission.  She has had no outpatient treatment.   PHYSICAL EXAMINATION:  An elderly female in no acute distress.  She was  lying in bed flat on her back, hardly moving due to history of neck pain and  back pain.   LABORATORY DATA:  CBC was within normal limits.  Potassium 3.4.  Urinalysis  too numerous to count with white blood cells.  TSH 0.824.  Urine drug screen  negative.  RPR negative.   HOSPITAL COURSE:  The patient was started on Ambien CR 12.5 mg p.o. q.h.s.  p.r.n., metoclopramide 10 mg p.o. q.d., Paxil CR 25 mg p.o. q.d., Synthroid  0.075 mg p.o. q.d., Wellbutrin XL 150 mg p.o. q.d., hydrocodone 5/325 mg, 1-  2 p.o. q.4-6h. p.r.n.  On April 19, 2006, the patient was started on Cipro  250 mg p.o. b.i.d. x3 days for a urinary tract  infection.  Ambien was  discontinued.  She was placed on trazodone 50 mg p.o. q.h.s. p.r.n.  An MRI  of the brain was initially ordered but this had been done since the patient  has a copy of results from December 06, 2005.  On April 22, 2006, the  patient's trazodone was increased to 75 mg p.o. q.h.s.   On first meeting with the patient, she stated she did not get any sleep the  night before.  She stated the pains were too bad to fall asleep.  She has  pain in her lumbar back as well as her cervical back.  She has talked about  living with her daughter and son.  She has no problems there but says she  stays in bed all the time.  On April 21, 2006, she is feeling better and  stating she wanted to sign out.  I advised that we will probably  discharge  her on Monday, April 24, 2006.  Daughter came to visit.  She stated that  her mother's doctor decided to cut down her pain medications and it was  subsequent to this maneuver that she saw her mother's mental status worsen.  The patient continued to be cooperative but still nonspontaneous.  The  patient states that she has had difficulty sleeping.  The trazodone was  increased to 75 mg p.o. q.a.m.  She continued to feel better through the  hospitalization.   On April 23, 2006, the patient's daughter, the patient's brother and the  patient's pastor attended the family session.  Family discussed concerns of  pain management and physical concerns.  They minimized the patient's  suicidal or homicidal ideation.  The patient stated she was not having  suicidal or homicidal ideation and no longer wanted to harm herself.  The  patient expressed concerns around having medications managed.  A referral to  a doctor for medication management needs to made.  The family identify  several supports and caretakers for the patient when she returns home and  stated she will not be alone.  On April 24, 2006, the patient's mental  status had improved.  Her mood  was less depressed and anxious.  Affect was  wider range.  She was not suicidal or homicidal.  She had no auditory or  visual hallucinations.  No delusions or paranoia.  Thoughts were logical and  goal directed.  She still somewhat nonspontaneous but improved from  admission status.  Cognitive was grossly back to baseline.   DISCHARGE DIAGNOSES:  AXIS I:  Major depressive disorder, recurrent, severe.  AXIS II:  None.  AXIS III:  Chronic pain, status post UPI, hypothyroidism.  AXIS IV:  Moderate (medical problems).  AXIS V:  GAF upon discharge 35; GAF upon admission 25; GAF highest past year  65.   ACTIVITY/DIET:  She had no specific activity level or dietary restrictions  other than she needed to walk carefully due to her back pain.   DISCHARGE MEDICATIONS:  1. Reglan 10 mg q.d.  2. Paxil CR 25 mg tablets daily.  3. Levothyroxine 75 mcg daily.  4. Wellbutrin HCL 300 mg daily.  5. Trazodone 75 mg q.h.s.  6. Lorcet as directed.   POST-HOSPITAL CARE PLANS:  The patient will be seen at the [facility] by Donnie Aho on May 10, 2006 at 2 p.m.  The  family also wants to get her into a new pain clinic, possibly at [facility].      Jasmine Pang, M.D.  Electronically Signed     BHS/MEDQ  D:  04/24/2006  T:  04/25/2006  Job:  [id number]

## 2011-01-14 NOTE — Op Note (Signed)
[name], [name]                ACCOUNT NO.:  [id number]   MEDICAL RECORD NO.:  [id number]          PATIENT TYPE:  INP   LOCATION:  2009                         FACILITY:  [facility]   PHYSICIAN:  Shirley Friar, MDDATE OF BIRTH:  October 17, 1940   DATE OF PROCEDURE:  09/13/2005  DATE OF DISCHARGE:                                 OPERATIVE REPORT   INDICATIONS FOR PROCEDURE:  Chest pain, dysphagia, and abdominal pain.   MEDICATIONS:  1.  Fentanyl 50 mcg IV.  2.  Versed 7 mg IV.   FINDINGS:  1.  Atrophic gastritis.  2.  Small hiatal hernia.   DESCRIPTION OF PROCEDURE:  The endoscope was inserted into the oropharynx  and esophagus was intubated with the GEJ approximately 40 cm from the  incisors.  The endoscope was advanced down into the stomach which had  diffuse atrophic gastritis appearance.  No ulcers or mucosal lesions were  noted.  Retroflexion revealed a small hiatal hernia, otherwise normal  cardia, fundus, and angularis.  Endoscope was straightened and advanced down  into the duodenum bulb which was normal in appearance, as was the second  portion of the duodenum.  The endoscope was withdrawn and compared the above  findings.   ASSESSMENT:  1.  Atrophic gastritis.  2.  Hiatal hernia (small), otherwise normal EGD.   PLAN:  1.  Barium swallow plus upper gastrointestinal series to rule out esophageal      dysmotility.  2.  Check H. pylori serology and treat if positive.  3.  Consider trial of Reglan but await gastric emptying study.  4.  Low fat, small, frequent meals.      Shirley Friar, MD  Electronically Signed    VCS/MEDQ  D:  09/13/2005  T:  09/13/2005  Job:  [id number]   cc:   Fayrene Fearing L. Malon Kindle., M.D.  Fax: [phone]   Theone Stanley, MD

## 2011-01-14 NOTE — Procedures (Signed)
[name], [name]                ACCOUNT NO.:  [id number]   MEDICAL RECORD NO.:  [id number]          PATIENT TYPE:  OUT   LOCATION:  SLEEP CENTER                 FACILITY:  [facility]   PHYSICIAN:  Clinton D. Maple Hudson, M.D. DATE OF BIRTH:  07-23-1941   DATE OF STUDY:  09/10/2005                              NOCTURNAL POLYSOMNOGRAM   REFERRING PHYSICIAN:  Dr. Deatra James.   DATE OF STUDY:  September 08, 2005.   INDICATION FOR STUDY:  Hypersomnia with sleep apnea. Complaints of numbness  around mouth and legs at night. A baseline diagnostic NPSG on July 07, 2003 had reported an AHI of 18 per hour. She did not tolerate C-PAP trial on  that study night but subsequently has been fitted and has been wearing C-PAP  at 9 CWP with a nasal mask. Split study protocol was requested.   SLEEP ARCHITECTURE:  Total sleep time 392 minutes with sleep efficiency 78%.  Stage I was 2%, stage II 77%, stages III and IV 2%, REM 19% of total sleep  time. Sleep latency 45 minutes, REM latency 128 minutes, awake after sleep  onset 62 minutes, arousal index 7.2. An over-the-counter sleep medication  was taken at 9:30 p.m.   RESPIRATORY DATA:  Split study protocol:  Apnea/hypopnea index (AHI, RDI)  3.2 obstructive events per hour which is within normal limits (0/5 per  hour). This included 13 obstructive apneas and 8 hypopneas. She slept  supine. REM AHI 0.8 per hour. There were insufficient events to permit use  of C-PAP titration by split protocol on this study night.   OXYGEN DATA:  Moderate to loud intermittent snoring with oxygen desaturation  to a nadir of 88%, briefly. Mean oxygen saturation through the study was 96%  on room air.   CARDIAC DATA:  Normal sinus rhythm.   MOVEMENT/PARASOMNIA:  Occasional limb jerk with no effect on sleep.   IMPRESSION/RECOMMENDATIONS:  1.  Occasional sleep disorder breathing events within normal limits (AHI 3.2      per hour). She slept primarily on her back after  taking an over-the-      counter sleep medicine.  2.  Moderate snoring with oxygen desaturation to a nadir of 88% and mean      oxygen saturation on room air of 96% through the study.  3.  The study does not document the need for ongoing C-PAP use but her      snoring does suggest some degree of upper airway congestion or      obstruction. Consider treatment directed at that would be helpful. C-PAP      in this context is not likely to be harmful unless it is contributing to      her paresthesias (numbness). Oxygen desaturation events were very      transient and not likely significantly impacted by home C-PAP. It is      possible      that on some occasions she has more significant sleep apnea or upper      airway resistance syndrome (subclinical apnea patterns with arousal, not      meeting scoring rules for formal apneas and hypopneas).  Clinton D. Maple Hudson, M.D.  Diplomate, Biomedical engineer of Sleep Medicine  Electronically Signed     CDY/MEDQ  D:  09/11/2005 14:08:58  T:  09/12/2005 00:20:32  Job:  [id number]

## 2011-01-14 NOTE — H&P (Signed)
[name], [name]                ACCOUNT NO.:  [id number]   MEDICAL RECORD NO.:  [id number]          PATIENT TYPE:  OUT   LOCATION:  SLEE                         FACILITY:  [facility]   PHYSICIAN:  Melissa L. Ladona Ridgel, MD  DATE OF BIRTH:  09/29/1940   DATE OF ADMISSION:  DATE OF DISCHARGE:                                HISTORY & PHYSICAL   CHIEF COMPLAINT:  Chest pain.   PRIMARY CARE PHYSICIAN:  Holley Bouche, M.D.   HISTORY OF PRESENT ILLNESS:  The patient is 70 year old white female with  symptoms of upper respiratory infection treated around Thanksgiving with  steroids and antibiotics.  She states the cough resolved but she continued  to have intensification of a type of chest pain that she has been having for  the past two years.  She describes the chest pain as squeezing in nature,  usually 8/10.  It has awakened her from sleep and is associated with  sensation of being choked and losing her breath.  It travels up into her  neck.  She has no nausea, vomiting or diaphoresis related to this.  The  patient states that for the past two years, the chest pain has been treated  as if it is reflux and has very little success in controlling the symptoms.  There is no evidence for exacerbation with exertion or any relief with any  of the medications that are currently being given to her for reflux.  She  states that last night  she felt as if she was going to stop breathing and  woke up from sleep with this sensation.  She describes the pressure in her  chest as pretty severe.  She states that the night before, she was doing a  sleep study in the sleep lab and experienced the same sensation.  Correlating with that sensation was report from the staff that she had some  sort of a ominous event.  At current, the patient has a sensation of  pressure in her chest.  She describes it as 8/10 which is down to 9/10 from  admission where she was given nitroglycerin.  Pressure is worse at night and  more continuous.  The patient has had excessive work-up for her GI issues  and this afternoon, after she contacted pulmonology about her symptoms, she  was sent to the emergency room thinking that this was related to her cardiac  status.  The patient reports recent excessive fatigue.   REVIEW OF SYSTEMS:  Further review of systems:  Some blurred vision on  arrival at the emergency room relieved with the oxygen. She has had some  weight loss but cannot quantify it.  Some early satiety.  No nausea, no  vomiting, no dysuria, no melena.  All other review of systems are negative.   PAST MEDICAL HISTORY:  1.  Sleep apnea on CPAP for two years.  2.  GERD.  3.  Hypothyroidism.  4.  Hiatal hernia.  5.  She describes no diabetes and no hypertension.  6.  She also complains of neuropathic sensation in both hands and feet.  PAST SURGICAL HISTORY:  1.  Hysterectomy secondary to heavy bleeding.  2.  Diskectomy in the cervical region requiring plating.   SOCIAL HISTORY:  She denies tobacco or ethanol. She was a Runner, broadcasting/film/video of  kindergarten and preschool level.   FAMILY HISTORY:  Mom was deceased with Alzheimer's.  Dad is deceased with  hypertension.   ALLERGIES:  1.  PENICILLIN.  2.  She cannot take NEXIUM or PROTONIX which worsens her symptoms of      numbness in her hands and feet.   MEDICATIONS:  1.  Armour Thyroid 60 mg alternating with 30 mg daily.  2.  Estrace 2.5 mg daily.  3.  She takes Zegerid 40/1100 once daily which is new for her for one week.   PHYSICAL EXAMINATION:  VITAL SIGNS:  Temperature 97.9, blood pressure  115/50, pulse 60, respiratory rate 20, oxygen saturation 100%.  GENERAL:  She is in no acute distress.  HEENT:  Normocephalic, atraumatic.  Pupils are equal, round and reactive to  light.  Extraocular muscles were intact.  Funduscopic exam is within normal  limits.  There is no AV nicking.  No cotton-wool spots.  The posterior wall  of her oropharynx is nearly  obliterated by her soft palate.  NECK:  Supple. There are prominent jugular venous pulses.  CHEST:  Clear to auscultation.  No rhonchi, rales or wheezes.  CARDIOVASCULAR:  Regular rate and rhythm.  Positive S1 and S2.  No S3 or S4.  No murmurs, rubs or gallops.  ABDOMEN:  Soft, nontender, nondistended with positive bowel sounds.  Sensation of fullness in the epigastrium.  EXTREMITIES:  Showed no clubbing, cyanosis or edema. She has perceived  numbness in her feet extending up to the ankles bilaterally and somewhat in  her hands.  NEUROLOGIC:  As stated, funduscopic exam is within normal limits.  Cranial  nerves II-XII intact.  Power is 5/5.  Deep tendon reflexes are 2+.  Plantars  are downgoing.  Sensation appears to be intact.   LABORATORY DATA:  White count is 4.1, hemoglobin 12.4, hematocrit 36,  platelets 111.  Sodium 141, potassium 2.7, chloride 108, CO2 30, BUN 13,  creatinine 0.8, glucose 94.  LFTs within normal limits.  Point-of-care  enzymes are negative x2.   ASSESSMENT/PLAN:  This is a 70 year old white female being treated for two  years for gastroesophageal reflux disease as a source for chest discomfort.  She recently noted worsening sensation of chest pressure which awakens her  at night with the sensation of choking and pressure in her neck.  It has  escalated over the past one month.   1.  Cardiovascular:  Rule out myocardial infarction.  Will start her on      aspirin, Lovenox q.12h.  Continue omeprazole only but  hold the Zegerid      and try the low-dose Lopressor.  Cardiology has been consulted.  Will      obtain a 2-D echo and check a D-dimer to rule out chronic pulmonary      edema.  2.  Pulmonary:  She will use her CPAP on her home setting and, as stated, I      would like to visit the possibility for chronic occult pulmonary      embolus.  3.  Gastrointestinal:  She has a hiatal hernia and gastroesophageal reflux     disease.  Will treat her with omeprazole  and hold the Zegerid.  4.  Genitourinary:  Will check a urinalysis, culture and  sensitivity.  5.  Endocrine:  She is hypothyroid. Will continue her Armour Thyroid and      check a TSH.  6.  Deep venous thrombosis prophylaxis:  Will use Lovenox.  7.  Neurologic:  She has a sensation of neuropathy.  I would like to check a      B12, folate, thyroid studies, and beta-2 microglobulin.      Melissa L. Ladona Ridgel, MD  Electronically Signed     MLT/MEDQ  D:  09/10/2005  T:  09/11/2005  Job:  [id number]   cc:   Holley Bouche, M.D.  Fax: [phone]

## 2011-01-14 NOTE — Discharge Summary (Signed)
[name], [name]                ACCOUNT NO.:  [id number]   MEDICAL RECORD NO.:  [id number]          PATIENT TYPE:  INP   LOCATION:  2009                         FACILITY:  [facility]   PHYSICIAN:  Theone Stanley, MD   DATE OF BIRTH:  [birth year]   DATE OF ADMISSION:  09/10/2005  DATE OF DISCHARGE:                                 DISCHARGE SUMMARY   ADMITTING DIAGNOSES:  1.  Atypical chest pain.  2.  Hypothyroidism.  3.  Gastroesophageal reflux disease.  4.  Hiatal hernia.  5.  Sleep apnea.   DISCHARGE DIAGNOSES:  1.  Gastroparesis.  2.  Evidence of gastroesophageal reflux.  3.  Hypothyroidism.  4.  Sleep apnea.  5.  Chest pain, gastrointestinal etiology.   CONSULTATIONS:  1.  W. Viann Fish, M.D. with cardiology.  2.  Llana Aliment. Randa Evens, M.D. with gastroenterology.  3.  Coralyn Helling, M.D. with pulmonology.   PROCEDURES DIAGNOSTIC TESTS:  1.  The patient had an abdominal ultrasound performed on January 14.  There      is a single gallbladder polyp, no stones or biliary duct dilatation.      There are at least three cysts of the left lobe of the liver.  The      largest is septated.  Consider CT scan.  This can be done as an      outpatient.  2.  Gastric emptying study performed on the 16th which showed delayed      gastric emptying at 60 minutes, 82% of contents remain in the stomach.      At 120 minutes, 77% remained.  3.  On January 17th, the patient had an upper GI series with high density      barium and KUB.  Impression:  Small sliding hiatal hernia.  Spontaneous      GERD to the level of the clavicles with slow clearing of the esophagus      following reflux.  Minimal intermittent tertiary peristalsis activity in      the mid esophagus.  4.  Echocardiogram was performed on January 15th.  Impression:  Overall left      ventricular systolic function was normal.  Left ventricular EF was 60%.      There was no left ventricular wall motion abnormalities.  Normal echo   and Doppler.  5.  The patient had a cardiac catheterization performed on January 15th.      Impression:  Normal left ventricular function and normal LVEDP, normal      coronary arteries.  6.  On January 16th, the patient had an EGD.  It showed atrophic gastritis,      small hiatal hernia.  H. pylori serology pending.   HOSPITAL COURSE:  [name] is a 70 year old female who presents on January  3 with complaints of chest pain.  Apparently, the patient had symptoms of  upper respiratory infection treated around thanksgiving with steroid and  antibiotics.  Her cough resolved; however, after her treatment, she had  intensification of chest pain which she has been having for the past two  years.  She describes the chest pain as squeezing in nature, usually 8/10,  awakening her from sleep and associated with sensation of being chocked and  losing her breath.  It travels up to her neck.  She has nausea, vomiting or  diaphoresis related to this.  The patient has been treated for reflux over  the past two years.  However, according to her, there has been very little  success.  Her pain is described as severe pressure-type pain.  The pain she  described was 8/10 or 9/10.  The pressure is worse at night.  On her  admission, the patient had extensive workup.  Cardiovascular including a  cardiac catheterization which was negative.  Pulmonary was consulted.  They  did not feel it was pulmonary in nature.  Her D-dimer was less than 0.22.  because of her workup, it was felt that it was most likely gastrointestinal  in nature.  The patient had gastric emptying study which was consistent with  gastroparesis.  In addition, she had a barium study which was consistent  with reflux all the way to the clavicle area.  GI was consulted.  They  performed an EGD which didn't show any major abnormalities.  It was felt  that this is most likely secondary to her GI issues.  She is to start on  Reglan.  However, I  feel that there is a component of anxiety associated  with this patient when she is given Xanax, her symptoms were relieved during  the nighttime.  The patient had the sensation that she was dying secondary  to this pain.  I explained to her that she has had an extensive workup.  Everything that could possibly be danger has been evaluated and there is no  reason to be fearful.  In addition, I instructed her to continue with  precautions for her gastroesophageal reflux disease.  In addition, because  she has gastroparesis, she is to continue with a low fat, small frequent  meals.   DISCHARGE MEDICATIONS:  1.  Ativan 0.5 mg one p.o. b.i.d. p.r.n.  2.  Reglan 10 mg one p.o. a.c. and h.s., maximum of four doses in a day.  3.  Estradiol 2.5 mg one p.o. daily.  4.  Zegerid 40/1100 p.o. daily  5.  Amaryl 60 p.o. one p.o. daily.   Follow up  PCP in 3 weeks      Theone Stanley, MD  Electronically Signed     AEJ/MEDQ  D:  09/14/2005  T:  09/14/2005  Job:  [id number]   cc:   Georga Hacking, M.D.  Fax: [phone]  Email: [email or web address]

## 2011-01-14 NOTE — Cardiovascular Report (Signed)
[name], [name]                ACCOUNT NO.:  [id number]   MEDICAL RECORD NO.:  [id number]          PATIENT TYPE:  INP   LOCATION:  2009                         FACILITY:  [facility]   PHYSICIAN:  Georga Hacking, M.D.DATE OF BIRTH:  [birth year]   DATE OF PROCEDURE:  09/12/2005  DATE OF DISCHARGE:                              CARDIAC CATHETERIZATION   INDICATIONS:  A 70 year old female with recurrent chest pressure occurring  at rest despite medical therapy.  The study is done to rule out coronary  artery stenosis.   PROCEDURE:  Left heart catheterization with coronary angiograms and left  ventriculogram,.   COMMENTS ABOUT PROCEDURE:  The patient tolerated this procedure well without  complication.  Following the procedure, she had good hemostasis and good  peripheral pulses noted.   HEMODYNAMIC DATA:  Aorta postcontrast 115/58, LV postcontrast 115/6 to 10.   ANGIOGRAPHIC DATA:  Left ventriculogram:  Performed in the 30-degree RAO  projection. The aortic valve is normal. The mitral valve is normal. The left  ventricle appears normal in size.  Estimated ejection fraction is 60%.   Coronary arteries:  Arise and distribute normally.  This system is left  dominant.  There is no significant coronary calcification noted.   Left main coronary artery appears normal.   Left anterior descending:  Large vessel which has mild tortuosity and  supplies the apex.  A small diagonal branch arises.  There is some  irregularity at the ostium, but the remainder of the vessel appears normal.   Circumflex coronary artery:  Large dominant vessel supplying the posterior  descending artery.  There are a series of several marginal branches as well  as a posterior descending and posterolateral branch which have moderate  tortuosity but which appear free of disease.  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   IMPRESSION:  1.  Normal left ventricular function with normal LVEDP.  2.  Normal coronary arteries.   RECOMMENDATIONS:  Evaluate for other causes of chest pressure.  Obtain  echocardiogram.      Georga Hacking, M.D.  Electronically Signed     WST/MEDQ  D:  09/12/2005  T:  09/12/2005  Job:  [id number]   cc:   Holley Bouche, M.D.  Fax: [phone]   Melissa L. Ladona Ridgel, MD

## 2011-01-14 NOTE — H&P (Signed)
[name], [name]                ACCOUNT NO.:  [id number]   MEDICAL RECORD NO.:  [id number]          PATIENT TYPE:  IPS   LOCATION:  0302                          FACILITY:  [facility]   PHYSICIAN:  Jasmine Pang, M.D. DATE OF BIRTH:  October 19, 1940   DATE OF ADMISSION:  04/18/2006  DATE OF DISCHARGE:                         PSYCHIATRIC ADMISSION ASSESSMENT   IDENTIFYING INFORMATION:  This is a 70 year old divorced white female  voluntarily admitted on April 18, 2006.   HISTORY OF PRESENT ILLNESS:  The patient presents with a history of self-  inflicted injury, cut her right wrist superficially with a razorblade a few  days prior to this admission.  The patient states her intention was to die.  She reports a history of chronic pain.  She denies any recent stressors or  changes in her life.  The patient states that she stopped driving one month  ago.  She states she has also been losing weight but denies any change in  her appetite.  She is not sleeping well.  She denies hallucinations or  anxiety and paranoid ideation and feels safe in this facility.   PAST PSYCHIATRIC HISTORY:  First admission.  She has had no other  psychiatric admissions.  No outpatient treatment.   SOCIAL HISTORY:  This is a 70 year old divorced white female.  Lives with  her son and daughter.  She is a retired Architectural technologist.  She has  completed high school.  No legal issues.   FAMILY HISTORY:  None.   ALCOHOL/DRUG HISTORY:  Nonsmoker.  Denies any alcohol or drug use.   PRIMARY CARE PHYSICIAN:  Dr. Yehuda Budd at [phone], who recommended inpatient  admission.   MEDICAL PROBLEMS:  Back pain, neck pain and hypothyroidism.   MEDICATIONS:  Has been on Reglan 5 mg, 1 q.i.d., Synthroid 0.075 mg, Lunesta  3 mg, hydrocodone for pain, Ambien CR 12.5 mg, Wellbutrin XR 150 mg, Paxil  CR 25 mg daily.   ALLERGIES:  PENICILLIN and CELEBREX.   REVIEW OF SYSTEMS:  Pertinent positives:  decreased appetite, insomnia,  depression, suicidal thoughts, positive for pain, positive for  hypothyroidism.  Pertinent negatives:  no chest pain, no shortness of  breath, no fever, no chills, no dysuria, no muscle weakness, no headaches or  falls.   PHYSICAL EXAMINATION:  VITAL SIGNS:  Her temperature is 98.5, heart rate 92,  respirations 18, blood pressure 122/80, weight 121 pounds, height 65 inches  tall.  GENERAL:  This is an elderly female in no acute distress.  She is lying in  the bed, flat on her back, hardly moving.  NECK:  Negative lymphadenopathy.  CHEST:  Clear.  BREASTS:  Exam deferred.  HEART:  Regular rate and rhythm.  No murmurs, gallops or rubs.  ABDOMEN:  Soft, flat, nontender.  GU:  Exam deferred.  EXTREMITIES:  The patient moves all extremities.  SKIN:  Warm and dry.  The patient has a very superficial abrasion to her  right wrist.  NEUROLOGIC:  Findings are intact and nonfocal.   LABORATORY DATA:  CBC is within normal limits.  Potassium 3.4.  Urinalysis  shows too numerous to count white blood cells.  TSH is 0.824.  Urine drug  screen is negative.  RPR is negative.   MENTAL STATUS EXAM:  Fully alert.  She is in the bed, lying flat.  Little  eye contact.  Little muscle movement.  Speech is clear, few responses.  Mainly responding only yes or no.  The patient feels sad.  The patient's  affect is blunt.  Thought processes are coherent.  No evidence of psychosis.  Cognitive function is intact.  Memory is good.  Judgment and insight are  fair.  Poor historian.   DIAGNOSES:  AXIS I:  Major depressive disorder.  Rule out early dementia.  AXIS II:  Deferred.  AXIS III:  Chronic pain, current urinary tract infection, hypothyroidism.  AXIS IV:  Medical problems, remainder deferred.  AXIS V:  Current 25.   PLAN:  The patient is a high fall risk.  Will contact family for background  information.  Will resume her medications.  We will put patient on Cipro for  urinary tract infection.  Monitor  food and fluid intake.  Will consider a PT  consult for patient's complaints of pain with deconditioning and for  assisting with ambulation.  The patient may need a CAT scan for dementia  workup.   TENTATIVE LENGTH OF STAY:  Five to six days.      Landry Corporal, N.P.      Jasmine Pang, M.D.  Electronically Signed    JO/MEDQ  D:  04/22/2006  T:  04/22/2006  Job:  [id number]

## 2011-01-14 NOTE — Procedures (Signed)
[facility]  Patient:    [name], [name]                       MRN: [id number] Proc. Date: 12/04/00 Adm. Date:  [id number] Attending:  Orland Mustard CC:         Arvella Merles, M.D.   Procedure Report  PROCEDURE:  Colonoscopy.  MEDICATIONS:  Fentanyl 100 mcg, Versed 10 mg IV.  INDICATIONS FOR PROCEDURE:  The patients had previous colon polyps and had some recent bright red blood per rectum.  SCOPE:  Pediatric video colonoscope.  DESCRIPTION OF PROCEDURE:  The procedure had been explained to the patient and consent obtained. With the patient in the left lateral decubitus position, the pediatric video colonoscope was inserted and advanced under direct visualization. She had a known long tortuous colon from previous colonoscopy and again it was quite tortuous. We were able to eventually get through the sigmoid and advance over the cecum using abdominal pressure and position changes and multiple maneuvers including placing the patient on the right side. The ileocecal valve was identified and the right lower quadrant transilluminated and typical cecal configuration was seen. The scope was withdrawn and the cecum, ascending colon, hepatic flexure, transverse colon, splenic flexure, descending and sigmoid colon were seen well upon removal, no further polyps were seen. The scope was retroflexed in the rectum with a finding of fairly large internal hemorrhoids. The scope was withdrawn. The patient tolerated the procedure well.  ASSESSMENT: 1. No evidence of further polyps. 2. Internal hemorrhoids probably the source of her recent rectal bleeding.  PLAN:  Will give her the hemorrhoid sheet and recommend repeating her colonoscopy in five years. DD:  12/04/00 TD:  12/04/00 Job: [id number]

## 2012-05-02 ENCOUNTER — Encounter: Payer: Self-pay | Admitting: Cardiology

## 2012-05-22 ENCOUNTER — Ambulatory Visit (INDEPENDENT_AMBULATORY_CARE_PROVIDER_SITE_OTHER): Payer: Medicare Other | Admitting: Cardiology

## 2012-05-22 ENCOUNTER — Encounter: Payer: Self-pay | Admitting: Cardiology

## 2012-05-22 VITALS — BP 126/80 | HR 72 | Resp 18 | Ht 64.0 in | Wt 191.0 lb

## 2012-05-22 DIAGNOSIS — R0789 Other chest pain: Secondary | ICD-10-CM

## 2012-05-22 NOTE — Progress Notes (Signed)
   [name] Date of Birth: 1941-05-17 Medical Record #[id number]  History of Present Illness: [name] is seen today for evaluation of chest pain at the request of Dr. Collins Scotland. She is a 71 year old white female who has no cardiac history. She was evaluated in 2009 with a stress Cardiolite study which was normal. She has no history of hypertension, diabetes, or hyperlipidemia. She is a nonsmoker. On July 4 of 2013 while on vacation she was getting out of her car when she experienced a sudden sharp stabbing pain in the left upper precordial area. This was described as knifelike. There was no radiation. She did feel short of breath. After 30 minutes her pain went away. She's had no recurrent chest pain since then. She did not seek medical attention at that time. She was concerned that she may have had a heart attack.  Current Outpatient Prescriptions on File Prior to Visit  Medication Sig Dispense Refill  . buPROPion (WELLBUTRIN XL) 300 MG 24 hr tablet       . levothyroxine (SYNTHROID, LEVOTHROID) 75 MCG tablet       . PARoxetine (PAXIL-CR) 25 MG 24 hr tablet         Allergies  Allergen Reactions  . Penicillins     Past Medical History  Diagnosis Date  . Hypothyroidism   . Acid reflux disease   . Depression   . Hiatal hernia   . H/O: hysterectomy   . Self inflicted injury   . Back pain   . Neck pain   . Gastroparesis   . Sleep apnea   . Atypical chest pain     Past Surgical History  Procedure Date  . Neck surgery     Metal plate in neck for degenerative disc disease  . Abdominal hysterectomy     History  Smoking status  . Never Smoker   Smokeless tobacco  . Not on file    History  Alcohol Use No    History reviewed. No pertinent family history.  Review of Systems: The review of systems is positive for significant weight gain over the past few years. She is trying to walk more.  All other systems were reviewed and are negative.  Physical Exam: BP 126/80   Pulse 72  Resp 18  Ht 5\' 4"  (1.626 m)  Wt 191 lb (86.637 kg)  BMI 32.79 kg/m2  SpO2 97% She is a pleasant, overweight white female in no acute distress. HEENT exam is unremarkable. She has no JVD or bruits. No adenopathy or thyromegaly. Lungs are clear. Cardiac exam reveals a regular rate and rhythm without gallop, murmur, or click. She has no chest wall tenderness to palpation. Abdomen is obese, soft, nontender. She has no masses or bruits. Extremities are without edema. Pedal pulses are good. Skin is warm and dry. She is alert and oriented x3. Cranial nerves II through XII are intact. LABORATORY DATA: ECG today demonstrates normal sinus rhythm with LVH by voltage. Otherwise normal. From July 2013 showed a normal chemistry panel and thyroid function studies. Urinalysis was negative.  Assessment / Plan: 1. Atypical chest pain. She had a single episode of sharp knifelike pain that resolved spontaneously. There is no evidence of myocardial infarction by ECG. Showed a normal nuclear stress test in 2009. Given the fact that she has had no recurrent chest pain I would not pursue additional evaluation at this time. She has no significant cardiac risk factors.

## 2012-05-22 NOTE — Patient Instructions (Signed)
Continue your current therapy.  I will see you again as needed.   

## 2012-05-25 ENCOUNTER — Encounter: Payer: Self-pay | Admitting: Cardiology

## 2012-10-26 DIAGNOSIS — G4730 Sleep apnea, unspecified: Secondary | ICD-10-CM | POA: Insufficient documentation

## 2013-05-03 ENCOUNTER — Other Ambulatory Visit: Payer: Self-pay | Admitting: Obstetrics and Gynecology

## 2013-05-03 DIAGNOSIS — N644 Mastodynia: Secondary | ICD-10-CM

## 2013-05-03 DIAGNOSIS — N6459 Other signs and symptoms in breast: Secondary | ICD-10-CM

## 2013-05-09 ENCOUNTER — Ambulatory Visit
Admission: RE | Admit: 2013-05-09 | Discharge: 2013-05-09 | Disposition: A | Discharge status: Home / Self Care | Payer: Medicare Other | Source: Ambulatory Visit | Attending: Obstetrics and Gynecology | Admitting: Obstetrics and Gynecology

## 2013-05-09 DIAGNOSIS — N6459 Other signs and symptoms in breast: Secondary | ICD-10-CM

## 2013-05-09 DIAGNOSIS — N644 Mastodynia: Secondary | ICD-10-CM

## 2013-10-12 ENCOUNTER — Encounter (HOSPITAL_BASED_OUTPATIENT_CLINIC_OR_DEPARTMENT_OTHER): Payer: Self-pay | Admitting: Emergency Medicine

## 2013-10-12 ENCOUNTER — Emergency Department (HOSPITAL_BASED_OUTPATIENT_CLINIC_OR_DEPARTMENT_OTHER)
Admission: EM | Admit: 2013-10-12 | Discharge: 2013-10-12 | Disposition: A | Discharge status: Home / Self Care | Payer: Medicare Other | Attending: Emergency Medicine | Admitting: Emergency Medicine

## 2013-10-12 ENCOUNTER — Emergency Department (HOSPITAL_BASED_OUTPATIENT_CLINIC_OR_DEPARTMENT_OTHER): Payer: Medicare Other

## 2013-10-12 DIAGNOSIS — F329 Major depressive disorder, single episode, unspecified: Secondary | ICD-10-CM | POA: Insufficient documentation

## 2013-10-12 DIAGNOSIS — F3289 Other specified depressive episodes: Secondary | ICD-10-CM | POA: Insufficient documentation

## 2013-10-12 DIAGNOSIS — K219 Gastro-esophageal reflux disease without esophagitis: Secondary | ICD-10-CM

## 2013-10-12 DIAGNOSIS — Z880 Allergy status to penicillin: Secondary | ICD-10-CM | POA: Insufficient documentation

## 2013-10-12 DIAGNOSIS — E039 Hypothyroidism, unspecified: Secondary | ICD-10-CM | POA: Insufficient documentation

## 2013-10-12 DIAGNOSIS — R05 Cough: Secondary | ICD-10-CM | POA: Insufficient documentation

## 2013-10-12 DIAGNOSIS — Z90710 Acquired absence of both cervix and uterus: Secondary | ICD-10-CM | POA: Insufficient documentation

## 2013-10-12 DIAGNOSIS — R059 Cough, unspecified: Secondary | ICD-10-CM

## 2013-10-12 MED ORDER — ESOMEPRAZOLE MAGNESIUM 40 MG PO CPDR: 40.0000 mg | DELAYED_RELEASE_CAPSULE | Freq: Every day | ORAL | Status: DC

## 2013-10-12 NOTE — ED Notes (Signed)
Report a cough that typically happens at night when she lays down.  Non produsctive, feels like someone is 'choking' her.  Sitting upright helps the cough.  Denies chest pain.  Is not currently taking any medication for her GERD.

## 2013-10-12 NOTE — Discharge Instructions (Signed)
Cough, Adult  A cough is a reflex that helps clear your throat and airways. It can help heal the body or may be a reaction to an irritated airway. A cough may only last 2 or 3 weeks (acute) or may last more than 8 weeks (chronic).  CAUSES Acute cough:  Viral or bacterial infections. Chronic cough:  Infections.  Allergies.  Asthma.  Post-nasal drip.  Smoking.  Heartburn or acid reflux.  Some medicines.  Chronic lung problems (COPD).  Cancer. SYMPTOMS   Cough.  Fever.  Chest pain.  Increased breathing rate.  High-pitched whistling sound when breathing (wheezing).  Colored mucus that you cough up (sputum). TREATMENT   A bacterial cough may be treated with antibiotic medicine.  A viral cough must run its course and will not respond to antibiotics.  Your caregiver may recommend other treatments if you have a chronic cough. HOME CARE INSTRUCTIONS   Only take over-the-counter or prescription medicines for pain, discomfort, or fever as directed by your caregiver. Use cough suppressants only as directed by your caregiver.  Use a cold steam vaporizer or humidifier in your bedroom or home to help loosen secretions.  Sleep in a semi-upright position if your cough is worse at night.  Rest as needed.  Stop smoking if you smoke. SEEK IMMEDIATE MEDICAL CARE IF:   You have pus in your sputum.  Your cough starts to worsen.  You cannot control your cough with suppressants and are losing sleep.  You begin coughing up blood.  You have difficulty breathing.  You develop pain which is getting worse or is uncontrolled with medicine.  You have a fever. MAKE SURE YOU:   Understand these instructions.  Will watch your condition.  Will get help right away if you are not doing well or get worse. Document Released: 02/11/2011 Document Revised: 11/07/2011 Document Reviewed: 02/11/2011 [facility] Patient Information 2014 [place], [place].  Gastroesophageal Reflux  Disease, Adult Gastroesophageal reflux disease (GERD) happens when acid from your stomach flows up into the esophagus. When acid comes in contact with the esophagus, the acid causes soreness (inflammation) in the esophagus. Over time, GERD may create small holes (ulcers) in the lining of the esophagus. CAUSES   Increased body weight. This puts pressure on the stomach, making acid rise from the stomach into the esophagus.  Smoking. This increases acid production in the stomach.  Drinking alcohol. This causes decreased pressure in the lower esophageal sphincter (valve or ring of muscle between the esophagus and stomach), allowing acid from the stomach into the esophagus.  Late evening meals and a full stomach. This increases pressure and acid production in the stomach.  A malformed lower esophageal sphincter. Sometimes, no cause is found. SYMPTOMS   Burning pain in the lower part of the mid-chest behind the breastbone and in the mid-stomach area. This may occur twice a week or more often.  Trouble swallowing.  Sore throat.  Dry cough.  Asthma-like symptoms including chest tightness, shortness of breath, or wheezing. DIAGNOSIS  Your caregiver may be able to diagnose GERD based on your symptoms. In some cases, X-rays and other tests may be done to check for complications or to check the condition of your stomach and esophagus. TREATMENT  Your caregiver may recommend over-the-counter or prescription medicines to help decrease acid production. Ask your caregiver before starting or adding any new medicines.  HOME CARE INSTRUCTIONS   Change the factors that you can control. Ask your caregiver for guidance concerning weight loss, quitting smoking, and  alcohol consumption.  Avoid foods and drinks that make your symptoms worse, such as:  Caffeine or alcoholic drinks.  Chocolate.  Peppermint or mint flavorings.  Garlic and onions.  Spicy foods.  Citrus fruits, such as oranges,  lemons, or limes.  Tomato-based foods such as sauce, chili, salsa, and pizza.  Fried and fatty foods.  Avoid lying down for the 3 hours prior to your bedtime or prior to taking a nap.  Eat small, frequent meals instead of large meals.  Wear loose-fitting clothing. Do not wear anything tight around your waist that causes pressure on your stomach.  Raise the head of your bed 6 to 8 inches with wood blocks to help you sleep. Extra pillows will not help.  Only take over-the-counter or prescription medicines for pain, discomfort, or fever as directed by your caregiver.  Do not take aspirin, ibuprofen, or other nonsteroidal anti-inflammatory drugs (NSAIDs). SEEK IMMEDIATE MEDICAL CARE IF:   You have pain in your arms, neck, jaw, teeth, or back.  Your pain increases or changes in intensity or duration.  You develop nausea, vomiting, or sweating (diaphoresis).  You develop shortness of breath, or you faint.  Your vomit is green, yellow, black, or looks like coffee grounds or blood.  Your stool is red, bloody, or black. These symptoms could be signs of other problems, such as heart disease, gastric bleeding, or esophageal bleeding. MAKE SURE YOU:   Understand these instructions.  Will watch your condition.  Will get help right away if you are not doing well or get worse. Document Released: 05/25/2005 Document Revised: 11/07/2011 Document Reviewed: 03/04/2011 [facility] Patient Information 2014 [place], [place].

## 2013-10-12 NOTE — ED Provider Notes (Signed)
CSN: [id number]     Arrival date & time 10/12/13  1326 History  This chart was scribed for Ladarien Beeks B. Bernette MayersSheldon, MD by Danella Maiersaroline Early, ED Scribe. This patient was seen in room MH06/MH06 and the patient's care was started at 3:22 PM.    Chief Complaint  Patient presents with  . Cough   The history is provided by the patient. No language interpreter was used.   HPI Comments: Kristen MooresBarbara E Leonard is a 73 y.o. female who presents to the Emergency Department complaining of non-productive cough only at night while laying down onset 10 days ago. She states the cough improves when she sits up. She reports h/o acid reflux but states it has not been bothering her recently. She takes vinegar mixed with honey for her reflux and states this works for her.    Past Medical History  Diagnosis Date  . Hypothyroidism   . Acid reflux disease   . Depression   . Hiatal hernia   . H/O: hysterectomy   . Self inflicted injury   . Back pain   . Neck pain   . Gastroparesis   . Sleep apnea   . Atypical chest pain    Past Surgical History  Procedure Laterality Date  . Neck surgery      Metal plate in neck for degenerative disc disease  . Abdominal hysterectomy     No family history on file. History  Substance Use Topics  . Smoking status: Never Smoker   . Smokeless tobacco: Not on file  . Alcohol Use: No   OB History   Grav Para Term Preterm Abortions TAB SAB Ect Mult Living                 Review of Systems  Constitutional: Negative for fever.  HENT: Negative for congestion and postnasal drip.   Respiratory: Positive for cough. Negative for chest tightness, shortness of breath and wheezing.   Cardiovascular: Negative for chest pain and leg swelling.   A complete 10 system review of systems was obtained and all systems are negative except as noted in the HPI and PMH.     Allergies  Penicillins  Home Medications   Current Outpatient Rx  Name  Route  Sig  Dispense  Refill  . buPROPion  (WELLBUTRIN XL) 300 MG 24 hr tablet               . levothyroxine (SYNTHROID, LEVOTHROID) 75 MCG tablet               . PARoxetine (PAXIL-CR) 25 MG 24 hr tablet                BP 156/78  Pulse 84  Temp(Src) 97.8 F (36.6 C) (Oral)  Resp 24  Ht 5\' 5"  (1.651 m)  Wt 180 lb (81.647 kg)  BMI 29.95 kg/m2  SpO2 100% Physical Exam  Nursing note and vitals reviewed. Constitutional: She is oriented to person, place, and time. She appears well-developed and well-nourished.  HENT:  Head: Normocephalic and atraumatic.  Eyes: EOM are normal. Pupils are equal, round, and reactive to light.  Neck: Normal range of motion. Neck supple.  Cardiovascular: Normal rate, normal heart sounds and intact distal pulses.   Pulmonary/Chest: Effort normal and breath sounds normal. She has no wheezes. She has no rales.  Abdominal: Bowel sounds are normal. She exhibits no distension. There is no tenderness.  Musculoskeletal: Normal range of motion. She exhibits no edema and no tenderness.  Neurological:  She is alert and oriented to person, place, and time. She has normal strength. No cranial nerve deficit or sensory deficit.  Skin: Skin is warm and dry. No rash noted.  Psychiatric: She has a normal mood and affect.    ED Course  Procedures (including critical care time) Medications - No data to display  DIAGNOSTIC STUDIES: Oxygen Saturation is 100% on RA, normal by my interpretation.    COORDINATION OF CARE: 3:28 PM- Discussed treatment plan with pt which includes CXR. Pt agrees to plan.    Labs Review Labs Reviewed - No data to display Imaging Review Dg Chest 2 View  10/12/2013   CLINICAL DATA:  Persistent cough.  EXAM: CHEST  2 VIEW  COMPARISON:  09/10/2005  FINDINGS: The heart size and mediastinal contours are within normal limits. Both lungs are clear. Mild spondylosis of the spine. Fusion hardware over the cervical thoracic junction unchanged.  IMPRESSION: No active cardiopulmonary  disease.   Electronically Signed   By: Elberta Fortis M.D.   On: 10/12/2013 15:29    EKG Interpretation   None       MDM   Final diagnoses:  Cough  GERD (gastroesophageal reflux disease)   Mild cough with lying flat. CXR clear, doubt this is significant cardiac or pulmonary process. Suspect GERD, could be post-nasal drip. Plan PPI, OTC decongestant and PCP followup.   I personally performed the services described in this documentation, which was scribed in my presence. The recorded information has been reviewed and is accurate.      Hussam Muniz B. Bernette Mayers, MD 10/12/13 1556

## 2014-01-15 DIAGNOSIS — J45909 Unspecified asthma, uncomplicated: Secondary | ICD-10-CM | POA: Insufficient documentation

## 2014-01-23 ENCOUNTER — Other Ambulatory Visit (HOSPITAL_COMMUNITY): Payer: Self-pay | Admitting: Chiropractic Medicine

## 2014-01-23 ENCOUNTER — Ambulatory Visit (HOSPITAL_COMMUNITY)
Admission: RE | Admit: 2014-01-23 | Discharge: 2014-01-23 | Disposition: A | Discharge status: Home / Self Care | Payer: Medicare Other | Source: Ambulatory Visit | Attending: Chiropractic Medicine | Admitting: Chiropractic Medicine

## 2014-01-23 DIAGNOSIS — J40 Bronchitis, not specified as acute or chronic: Secondary | ICD-10-CM

## 2014-01-23 DIAGNOSIS — R059 Cough, unspecified: Secondary | ICD-10-CM | POA: Insufficient documentation

## 2014-01-23 DIAGNOSIS — R509 Fever, unspecified: Secondary | ICD-10-CM | POA: Insufficient documentation

## 2014-01-23 DIAGNOSIS — R0789 Other chest pain: Secondary | ICD-10-CM | POA: Insufficient documentation

## 2014-01-23 DIAGNOSIS — R05 Cough: Secondary | ICD-10-CM | POA: Insufficient documentation

## 2014-05-08 ENCOUNTER — Ambulatory Visit (HOSPITAL_COMMUNITY)
Admission: RE | Admit: 2014-05-08 | Discharge: 2014-05-08 | Disposition: A | Discharge status: Home / Self Care | Payer: Medicare Other | Source: Ambulatory Visit | Attending: Chiropractic Medicine | Admitting: Chiropractic Medicine

## 2014-05-08 ENCOUNTER — Other Ambulatory Visit (HOSPITAL_COMMUNITY): Payer: Self-pay | Admitting: Chiropractic Medicine

## 2014-05-08 DIAGNOSIS — X58XXXA Exposure to other specified factors, initial encounter: Secondary | ICD-10-CM | POA: Diagnosis not present

## 2014-05-08 DIAGNOSIS — M542 Cervicalgia: Secondary | ICD-10-CM

## 2014-05-08 DIAGNOSIS — S12600A Unspecified displaced fracture of seventh cervical vertebra, initial encounter for closed fracture: Secondary | ICD-10-CM | POA: Insufficient documentation

## 2014-05-08 DIAGNOSIS — M47812 Spondylosis without myelopathy or radiculopathy, cervical region: Secondary | ICD-10-CM | POA: Insufficient documentation

## 2014-05-08 DIAGNOSIS — M5030 Other cervical disc degeneration, unspecified cervical region: Secondary | ICD-10-CM | POA: Diagnosis not present

## 2014-05-08 DIAGNOSIS — Z981 Arthrodesis status: Secondary | ICD-10-CM | POA: Insufficient documentation

## 2015-05-05 ENCOUNTER — Other Ambulatory Visit (HOSPITAL_COMMUNITY): Payer: Self-pay | Admitting: Chiropractic Medicine

## 2015-05-05 ENCOUNTER — Ambulatory Visit (HOSPITAL_COMMUNITY)
Admission: RE | Admit: 2015-05-05 | Discharge: 2015-05-05 | Disposition: A | Discharge status: Home / Self Care | Payer: Medicare Other | Source: Ambulatory Visit | Attending: Chiropractic Medicine | Admitting: Chiropractic Medicine

## 2015-05-05 DIAGNOSIS — M542 Cervicalgia: Secondary | ICD-10-CM

## 2015-05-05 DIAGNOSIS — M4802 Spinal stenosis, cervical region: Secondary | ICD-10-CM | POA: Diagnosis not present

## 2016-04-29 ENCOUNTER — Ambulatory Visit (HOSPITAL_COMMUNITY)
Admission: RE | Admit: 2016-04-29 | Discharge: 2016-04-29 | Disposition: A | Discharge status: Home / Self Care | Payer: Medicare Other | Source: Ambulatory Visit | Attending: Chiropractic Medicine | Admitting: Chiropractic Medicine

## 2016-04-29 ENCOUNTER — Other Ambulatory Visit (HOSPITAL_COMMUNITY): Payer: Self-pay | Admitting: Chiropractic Medicine

## 2016-04-29 DIAGNOSIS — M25561 Pain in right knee: Secondary | ICD-10-CM | POA: Diagnosis present

## 2016-04-29 DIAGNOSIS — M1711 Unilateral primary osteoarthritis, right knee: Secondary | ICD-10-CM | POA: Diagnosis not present

## 2016-05-17 ENCOUNTER — Encounter (INDEPENDENT_AMBULATORY_CARE_PROVIDER_SITE_OTHER): Payer: Self-pay

## 2016-12-19 ENCOUNTER — Ambulatory Visit (INDEPENDENT_AMBULATORY_CARE_PROVIDER_SITE_OTHER): Payer: Medicare Other | Admitting: Orthopaedic Surgery

## 2016-12-19 ENCOUNTER — Ambulatory Visit (INDEPENDENT_AMBULATORY_CARE_PROVIDER_SITE_OTHER): Payer: Medicare Other

## 2016-12-19 ENCOUNTER — Encounter (INDEPENDENT_AMBULATORY_CARE_PROVIDER_SITE_OTHER): Payer: Self-pay | Admitting: Orthopaedic Surgery

## 2016-12-19 VITALS — BP 160/82 | HR 70 | Resp 12 | Ht 65.0 in | Wt 180.0 lb

## 2016-12-19 DIAGNOSIS — G8929 Other chronic pain: Secondary | ICD-10-CM | POA: Diagnosis not present

## 2016-12-19 DIAGNOSIS — M25562 Pain in left knee: Secondary | ICD-10-CM

## 2016-12-19 DIAGNOSIS — M25561 Pain in right knee: Secondary | ICD-10-CM

## 2016-12-19 MED ORDER — LIDOCAINE HCL 1 % IJ SOLN
5.0000 mL | INTRAMUSCULAR | Status: AC | PRN
  Administered 2016-12-19: 5 mL

## 2016-12-19 MED ORDER — BUPIVACAINE HCL 0.5 % IJ SOLN
3.0000 mL | INTRAMUSCULAR | Status: AC | PRN
  Administered 2016-12-19: 3 mL via INTRA_ARTICULAR

## 2016-12-19 MED ORDER — METHYLPREDNISOLONE ACETATE 40 MG/ML IJ SUSP
80.0000 mg | INTRAMUSCULAR | Status: AC | PRN
  Administered 2016-12-19: 80 mg

## 2016-12-19 NOTE — Progress Notes (Signed)
Office Visit Note   Patient: [name]           Date of Birth: 10/02/1940           MRN: [id number] Visit Date: 12/19/2016              Requested by: No referring provider defined for this encounter. PCP: Thora Lance, MD   Assessment & Plan: Visit Diagnoses:  1. Chronic pain of left knee   2. Chronic pain of right knee   End-stage osteoarthritis right knee, advanced osteoarthritis left knee We'll plan on injecting the medial compartment right knee with cortisone and reevaluate in 2 weeks. At that point consider injecting the left knee. Long discussion regarding the osteoarthritis in both knees and potential response to cortisone, NSAIDs, and Visco supplementation. We discussed total knee replacement and even Flexogenic. Office evaluation close to 30 minutes regarding consultation  Follow-Up Instructions: Return in about 2 weeks (around 01/02/2017).   Orders:  Orders Placed This Encounter  Procedures  . XR KNEE 3 VIEW RIGHT  . XR KNEE 3 VIEW LEFT   No orders of the defined types were placed in this encounter.     Procedures: Large Joint Inj Date/Time: 12/19/2016 3:34 PM Performed by: [name] Authorized by: [name]   Consent Given by:  Patient Timeout: prior to procedure the correct patient, procedure, and site was verified   Indications:  Pain and joint swelling Location:  Knee Site:  R knee Prep: patient was prepped and draped in usual sterile fashion   Needle Size:  25 G Needle Length:  1.5 inches Approach:  Anteromedial Ultrasound Guidance: No   Fluoroscopic Guidance: No   Arthrogram: No   Medications:  5 mL lidocaine 1 %; 80 mg methylPREDNISolone acetate 40 MG/ML; 3 mL bupivacaine 0.5 % Aspiration Attempted: No   Patient tolerance:  Patient tolerated the procedure well with no immediate complications     Clinical Data: No additional findings.   Subjective: Chief Complaint  Patient presents with  . Right Knee - Pain,  Numbness, Edema    [name] is a 76 y o that presents with bilateral knee pain. Some calf pain, numbness in feet, no radiating pain, edema  . Left Knee - Pain, Numbness, Edema  [name] is  having considerable trouble with both knees, right more than left. The right knee will become swollen stiff sore and achy. It certainly compromises many of her activities. To a lesser extent she's having similar problems on the left without swelling. He denies any significant back pain or hip discomfort. Denies history of injury or trauma  HPI  Review of Systems   Objective: Vital Signs: BP (!) 160/82   Pulse 70   Resp 12   Ht  (1.651 m)   Wt 180 lb (81.6 kg)   BMI 29.95 kg/m   Physical Exam  Ortho Exam right knee lacks about 6 to full extension and flexes about 102-3. No instability. Some popliteal fullness may be consistent with a popliteal cyst. No calf pain. No distal edema. +1 pulses normal sensibility. Skin intact.   Imaging: Xr Knee 3 View Left  Result Date: 12/19/2016 Films of the left knee were obtained in 3 projections standing. There is increased varus of about 1. The medial joint space is quite narrow but still open. There is subchondral sclerosis on both sides of the joint medially associated with peripheral osteophytes. Degenerative changes are also identified at the lateral compartment  and the patellofemoral joint.  Xr Knee 3 View Right  Result Date: 12/19/2016 Films of the right knee were obtained in 3 projections standing. There is bone-on-bone in the medial compartment with subchondral sclerosis and peripheral osteophytes. The lateral joint space is open but with peripheral osteophytes. Is about 3-4 of varus significant degenerative changes are also identified at the patellofemoral joint. All the above consistent with end-stage osteoarthritis.    PMFS History: Patient Active Problem List   Diagnosis Date Noted  . Atypical chest pain 05/22/2012   Past Medical  History:  Diagnosis Date  . Acid reflux disease   . Atypical chest pain   . Back pain   . Depression   . Gastroparesis   . H/O: hysterectomy   . Hiatal hernia   . Hypothyroidism   . Neck pain   . Self inflicted injury   . Sleep apnea     History reviewed. No pertinent family history.  Past Surgical History:  Procedure Laterality Date  . ABDOMINAL HYSTERECTOMY    . NECK SURGERY     Metal plate in neck for degenerative disc disease   Social History   Occupational History  . Not on file.   Social History Main Topics  . Smoking status: Never Smoker  . Smokeless tobacco: Never Used  . Alcohol use No  . Drug use: No  . Sexual activity: Not on file     [name], MD   Note - This record has been created using [employer].  Chart creation errors have been sought, but may not always  have been located. Such creation errors do not reflect on  the standard of medical care.

## 2017-01-02 ENCOUNTER — Ambulatory Visit (INDEPENDENT_AMBULATORY_CARE_PROVIDER_SITE_OTHER): Payer: Medicare Other | Admitting: Orthopaedic Surgery

## 2017-01-05 ENCOUNTER — Ambulatory Visit (INDEPENDENT_AMBULATORY_CARE_PROVIDER_SITE_OTHER): Payer: Medicare Other | Admitting: Orthopaedic Surgery

## 2017-01-05 ENCOUNTER — Encounter (INDEPENDENT_AMBULATORY_CARE_PROVIDER_SITE_OTHER): Payer: Self-pay | Admitting: Orthopaedic Surgery

## 2017-01-05 VITALS — BP 125/85 | HR 70 | Resp 14 | Ht 63.0 in | Wt 175.0 lb

## 2017-01-05 DIAGNOSIS — M1712 Unilateral primary osteoarthritis, left knee: Secondary | ICD-10-CM | POA: Insufficient documentation

## 2017-01-05 DIAGNOSIS — M1711 Unilateral primary osteoarthritis, right knee: Secondary | ICD-10-CM | POA: Diagnosis not present

## 2017-01-05 MED ORDER — BUPIVACAINE HCL 0.5 % IJ SOLN
3.0000 mL | INTRAMUSCULAR | Status: AC | PRN
  Administered 2017-01-05: 3 mL via INTRA_ARTICULAR

## 2017-01-05 MED ORDER — LIDOCAINE HCL 1 % IJ SOLN
5.0000 mL | INTRAMUSCULAR | Status: AC | PRN
  Administered 2017-01-05: 5 mL

## 2017-01-05 MED ORDER — METHYLPREDNISOLONE ACETATE 40 MG/ML IJ SUSP
80.0000 mg | INTRAMUSCULAR | Status: AC | PRN
  Administered 2017-01-05: 80 mg

## 2017-01-05 NOTE — Progress Notes (Signed)
Office Visit Note   Patient: [name]           Date of Birth: 01-08-1941           MRN: [id number] Visit Date: 01/05/2017              Requested by: Kristen Heys, MD [address] [place], [place] [address] PCP: Kristen Heys, MD   Assessment & Plan: Visit Diagnoses:  1. Unilateral primary osteoarthritis, left knee   2. Unilateral primary osteoarthritis, right knee     Plan: #1: Corticosteroid injection was instilled into the left knee without difficulty #2: If this is not effective then probably her next choice would be total joint replacement due to the fact she does not want to use any type of Visco supplementation.  Follow-Up Instructions: No Follow-up on file.   Orders:  No orders of the defined types were placed in this encounter.  No orders of the defined types were placed in this encounter.     Procedures: Large Joint Inj Date/Time: 01/05/2017 4:41 PM Performed by: [name] Authorized by: [name]   Consent Given by:  Patient Timeout: prior to procedure the correct patient, procedure, and site was verified   Indications:  Pain and joint swelling Location:  Knee Site:  L knee Prep: patient was prepped and draped in usual sterile fashion   Needle Size:  25 G Needle Length:  1.5 inches Approach:  Anteromedial Ultrasound Guidance: No   Fluoroscopic Guidance: No   Arthrogram: No   Medications:  5 mL lidocaine 1 %; 80 mg methylPREDNISolone acetate 40 MG/ML; 3 mL bupivacaine 0.5 % Aspiration Attempted: No   Patient tolerance:  Patient tolerated the procedure well with no immediate complications     Clinical Data: No additional findings.   Subjective: Chief Complaint  Patient presents with  . Left Knee - Edema, Pain    [name] is  having considerable trouble with both knees, right more than left. The right knee was injected 2 weeks ago with little relief. She would like to get onein the left knee today.        [name] very pleasant 76 year old white female who is  having considerable trouble with both knees, right more than left. The right knee will become swollen stiff sore and achy. It certainly compromises many of her activities. To a lesser extent she's having similar problems on the left without swelling.  Denies history of injury or trauma.  At 2 weeks ago she had the right knee injected which she states has only little bit of relief. However she would like to proceed with a corticosteroid injection to the left knee today.    Review of Systems  All other systems reviewed and are negative.    Objective: Vital Signs: BP 125/85   Pulse 70   Resp 14   Ht 5\' 3"  (1.6 m)   Wt 175 lb (79.4 kg)   BMI 31.00 kg/m   Physical Exam  Constitutional: She is oriented to person, place, and time. She appears well-developed and well-nourished.  HENT:  Head: Normocephalic and atraumatic.  Eyes: EOM are normal. Pupils are equal, round, and reactive to light.  Pulmonary/Chest: Effort normal.  Neurological: She is alert and oriented to person, place, and time.  Skin: Skin is warm and dry.  Psychiatric: She has a normal mood and affect. Her behavior is normal. Judgment and thought content normal.    Ortho Exam  Range  of motion today left knee reveals about 5 lack of full extension until 100 of flexion. Does have crepitance with range of motion in the patellofemoral area. No instability. Some popliteal fullness.  No calf pain. No distal edema. +1 pulses normal sensibility. Skin intact.  Specialty Comments:  No specialty comments available.  Imaging: No results found.   PMFS History: Patient Active Problem List   Diagnosis Date Noted  . Unilateral primary osteoarthritis, left knee 01/05/2017  . Unilateral primary osteoarthritis, right knee 01/05/2017  . Atypical chest pain 05/22/2012   Past Medical History:  Diagnosis Date  . Acid reflux disease   . Atypical chest pain   . Back  pain   . Depression   . Gastroparesis   . H/O: hysterectomy   . Hiatal hernia   . Hypothyroidism   . Neck pain   . Self inflicted injury   . Sleep apnea     History reviewed. No pertinent family history.  Past Surgical History:  Procedure Laterality Date  . ABDOMINAL HYSTERECTOMY    . NECK SURGERY     Metal plate in neck for degenerative disc disease   Social History   Occupational History  . Not on file.   Social History Main Topics  . Smoking status: Never Smoker  . Smokeless tobacco: Never Used  . Alcohol use No  . Drug use: No  . Sexual activity: Not on file

## 2017-01-31 ENCOUNTER — Encounter (INDEPENDENT_AMBULATORY_CARE_PROVIDER_SITE_OTHER): Payer: Self-pay | Admitting: Orthopaedic Surgery

## 2017-01-31 ENCOUNTER — Ambulatory Visit (INDEPENDENT_AMBULATORY_CARE_PROVIDER_SITE_OTHER): Payer: Medicare Other | Admitting: Orthopaedic Surgery

## 2017-01-31 VITALS — BP 144/75 | HR 72 | Ht 64.0 in | Wt 165.0 lb

## 2017-01-31 DIAGNOSIS — G8929 Other chronic pain: Secondary | ICD-10-CM | POA: Diagnosis not present

## 2017-01-31 DIAGNOSIS — M25561 Pain in right knee: Secondary | ICD-10-CM | POA: Diagnosis not present

## 2017-01-31 NOTE — Progress Notes (Signed)
Office Visit Note   Patient: [name]           Date of Birth: June 07, 1941           MRN: [id number] Visit Date: 01/31/2017              Requested by: Blair HeysEhinger, Robert, MD [address] [place], [place] [address] PCP: Blair HeysEhinger, Robert, MD   Assessment & Plan: Visit Diagnoses:  1. Chronic pain of right knee   End-stage osteoarthritis right knee  Plan: Long discussion regarding treatment options. Mrs. [name] wishes to proceed with a knee replacement. We discussed the surgery, inpatient nature, physical therapy, and need for a walker and limitations. She like to proceed. We'll give her a clearance form for Dr.Ehinger. Answered all present questions guarding procedure and other options  Follow-Up Instructions: Return will schedule knee replkacement.   Orders:  No orders of the defined types were placed in this encounter.  No orders of the defined types were placed in this encounter.     Procedures: No procedures performed   Clinical Data: No additional findings.   Subjective: Chief Complaint  Patient presents with  . Right Knee - Pain    Ms. [name] is a 76 y o that presents with R knee pain. She relates she is ready to discuss surgery.  Mrs. [name] is been seen recently for the osteoarthritis of both knees. She's had a cortisone injection on the left that is made "some difference". She also has arthritic changes on the right. After much "thought". She like to proceed with a right total knee replacement. Doing more and more difficulty with her ambulation and sleeping as well as compromise of her daily activities  HPI  Review of Systems   Objective: Vital Signs: BP (!) 144/75   Pulse 72   Ht 5\' 4"  (1.626 m)   Wt 165 lb (74.8 kg)   BMI 28.32 kg/m   Physical Exam  Ortho Exam right knee with minimal effusion. Does lack about 5 to full extension and flexes about 108. No instability. Predominantly medial joint pain. Some patella crepitation. No no opening  with varus or valgus stress. Negative anterior drawer sign. No calf pain. No distal edema. Neurovascular exam intact. Straight leg raise negative bilaterally. No pain range of motion of either hip  Specialty Comments:  No specialty comments available.  Imaging: No results found.   PMFS History: Patient Active Problem List   Diagnosis Date Noted  . Unilateral primary osteoarthritis, left knee 01/05/2017  . Unilateral primary osteoarthritis, right knee 01/05/2017  . Atypical chest pain 05/22/2012   Past Medical History:  Diagnosis Date  . Acid reflux disease   . Atypical chest pain   . Back pain   . Depression   . Gastroparesis   . H/O: hysterectomy   . Hiatal hernia   . Hypothyroidism   . Neck pain   . Self inflicted injury   . Sleep apnea     No family history on file.  Past Surgical History:  Procedure Laterality Date  . ABDOMINAL HYSTERECTOMY    . NECK SURGERY     Metal plate in neck for degenerative disc disease   Social History   Occupational History  . Not on file.   Social History Main Topics  . Smoking status: Never Smoker  . Smokeless tobacco: Never Used  . Alcohol use No  . Drug use: No  . Sexual activity: Not on file  Garald Balding, MD   Note - This record has been created using [employer].  Chart creation errors have been sought, but may not always  have been located. Such creation errors do not reflect on  the standard of medical care.

## 2017-02-07 ENCOUNTER — Telehealth (INDEPENDENT_AMBULATORY_CARE_PROVIDER_SITE_OTHER): Payer: Self-pay | Admitting: Orthopaedic Surgery

## 2017-02-07 NOTE — Telephone Encounter (Signed)
LVM with pt to please call to schedule surgery. Will try pt again at a later time. 

## 2017-02-08 ENCOUNTER — Encounter (INDEPENDENT_AMBULATORY_CARE_PROVIDER_SITE_OTHER): Payer: Self-pay | Admitting: Orthopaedic Surgery

## 2017-02-08 ENCOUNTER — Other Ambulatory Visit (INDEPENDENT_AMBULATORY_CARE_PROVIDER_SITE_OTHER): Payer: Self-pay | Admitting: Orthopaedic Surgery

## 2017-02-10 ENCOUNTER — Telehealth (INDEPENDENT_AMBULATORY_CARE_PROVIDER_SITE_OTHER): Payer: Self-pay | Admitting: Orthopaedic Surgery

## 2017-02-10 NOTE — Telephone Encounter (Signed)
[facility] called requesting orders for this pt to be entered for her pre-admission testing appt. Thanks so much!

## 2017-02-10 NOTE — Telephone Encounter (Signed)
Check with Brian

## 2017-02-10 NOTE — Telephone Encounter (Signed)
Surgery July 3,2018

## 2017-02-10 NOTE — Telephone Encounter (Signed)
PLease advise

## 2017-02-14 ENCOUNTER — Encounter (HOSPITAL_COMMUNITY): Payer: Self-pay

## 2017-02-14 ENCOUNTER — Encounter (HOSPITAL_COMMUNITY)
Admission: RE | Admit: 2017-02-14 | Discharge: 2017-02-14 | Disposition: A | Discharge status: Home / Self Care | Payer: Medicare Other | Source: Ambulatory Visit | Attending: Orthopaedic Surgery | Admitting: Orthopaedic Surgery

## 2017-02-14 ENCOUNTER — Ambulatory Visit (HOSPITAL_COMMUNITY)
Admission: RE | Admit: 2017-02-14 | Discharge: 2017-02-14 | Disposition: A | Discharge status: Home / Self Care | Payer: Medicare Other | Source: Ambulatory Visit | Attending: Orthopedic Surgery | Admitting: Orthopedic Surgery

## 2017-02-14 DIAGNOSIS — Z01818 Encounter for other preprocedural examination: Secondary | ICD-10-CM

## 2017-02-14 DIAGNOSIS — M47814 Spondylosis without myelopathy or radiculopathy, thoracic region: Secondary | ICD-10-CM | POA: Insufficient documentation

## 2017-02-14 HISTORY — DX: Unspecified osteoarthritis, unspecified site: M19.90

## 2017-02-14 LAB — CBC WITH DIFFERENTIAL/PLATELET
BASOS ABS: 0.1 10*3/uL (ref 0.0–0.1)
Basophils Relative: 1 %
Eosinophils Absolute: 0.2 10*3/uL (ref 0.0–0.7)
Eosinophils Relative: 3 %
HEMATOCRIT: 40.5 % (ref 36.0–46.0)
Hemoglobin: 13.1 g/dL (ref 12.0–15.0)
LYMPHS PCT: 41 %
Lymphs Abs: 2.5 10*3/uL (ref 0.7–4.0)
MCH: 29.4 pg (ref 26.0–34.0)
MCHC: 32.3 g/dL (ref 30.0–36.0)
MCV: 90.8 fL (ref 78.0–100.0)
MONO ABS: 0.5 10*3/uL (ref 0.1–1.0)
MONOS PCT: 9 %
NEUTROS ABS: 2.8 10*3/uL (ref 1.7–7.7)
Neutrophils Relative %: 46 %
Platelets: 233 10*3/uL (ref 150–400)
RBC: 4.46 MIL/uL (ref 3.87–5.11)
RDW: 13.5 % (ref 11.5–15.5)
WBC: 6 10*3/uL (ref 4.0–10.5)

## 2017-02-14 LAB — COMPREHENSIVE METABOLIC PANEL
ALK PHOS: 91 U/L (ref 38–126)
ALT: 17 U/L (ref 14–54)
AST: 21 U/L (ref 15–41)
Albumin: 3.9 g/dL (ref 3.5–5.0)
Anion gap: 8 (ref 5–15)
BILIRUBIN TOTAL: 0.7 mg/dL (ref 0.3–1.2)
BUN: 11 mg/dL (ref 6–20)
CALCIUM: 10 mg/dL (ref 8.9–10.3)
CO2: 27 mmol/L (ref 22–32)
CREATININE: 0.77 mg/dL (ref 0.44–1.00)
Chloride: 106 mmol/L (ref 101–111)
GFR calc Af Amer: >60 mL/min (ref >60)
Glucose, Bld: 95 mg/dL (ref 65–99)
POTASSIUM: 3.9 mmol/L (ref 3.5–5.1)
Sodium: 141 mmol/L (ref 135–145)
TOTAL PROTEIN: 6.6 g/dL (ref 6.5–8.1)

## 2017-02-14 LAB — URINALYSIS, ROUTINE W REFLEX MICROSCOPIC
Bilirubin Urine: NEGATIVE
Glucose, UA: NEGATIVE mg/dL
Hgb urine dipstick: NEGATIVE
Ketones, ur: NEGATIVE mg/dL
LEUKOCYTES UA: NEGATIVE
Nitrite: NEGATIVE
PH: 5 (ref 5.0–8.0)
Protein, ur: NEGATIVE mg/dL
SPECIFIC GRAVITY, URINE: 1.016 (ref 1.005–1.030)

## 2017-02-14 LAB — TYPE AND SCREEN
ABO/RH(D): B NEG
ANTIBODY SCREEN: NEGATIVE

## 2017-02-14 LAB — ABO/RH: ABO/RH(D): B NEG

## 2017-02-14 LAB — PROTIME-INR
INR: 1.04
PROTHROMBIN TIME: 13.7 s (ref 11.4–15.2)

## 2017-02-14 LAB — SURGICAL PCR SCREEN
MRSA, PCR: NEGATIVE
Staphylococcus aureus: NEGATIVE

## 2017-02-14 LAB — APTT: aPTT: 30 seconds (ref 24–36)

## 2017-02-14 NOTE — Pre-Procedure Instructions (Signed)
Marcello MooresBarbara E Renbarger  02/14/2017      [id number] [place], [place] [facility] [address] [facility] [address] [place] [address] Phone: [phone] Fax: [phone]    Your procedure is scheduled on February 28, 2017.  [facility]:00 A.M.  Call this number if you have problems the morning of surgery:  [phone]   Call [phone] if you have any questions prior to your surgery date Monday-Friday 8am-4pm   Remember:  Do not eat food or drink liquids after midnight.  Take these medicines the morning of surgery with A SIP OF WATER Levothyroxine (Synthroid), Albuterol (Ventolin) inhaler--bring inhaler with you.  7 days prior to surgery STOP taking any Aspirin, Aleve, Naproxen, Ibuprofen, Motrin, Advil, Goody's, BC's, all herbal medications, fish oil, and all vitamins   Do not wear jewelry, make-up or nail polish.  Do not wear lotions, powders, or perfumes, or deoderant.  Do not shave 48 hours prior to surgery.   Do not bring valuables to the hospital .  [facility] is not responsible for any belongings or valuables.  Contacts, dentures or bridgework may not be worn into surgery.  Leave your suitcase in the car.  After surgery it may be brought to your room.  For patients admitted to the hospital, discharge time will be determined by your treatment team.  Patients discharged the day of surgery will not be allowed to drive home.   Special instructions:   [facility]- Preparing For Surgery  Before surgery, you can play an important role. Because skin is not sterile, your skin needs to be as free of germs as possible. You can reduce the number of germs on your skin by washing with CHG (chlorahexidine gluconate) Soap before surgery.  CHG is an antiseptic cleaner which kills germs and bonds with the skin to continue killing germs even after washing.  Please do not use if you have an allergy to CHG or antibacterial  soaps. If your skin becomes reddened/irritated stop using the CHG.  Do not shave (including legs and underarms) for at least 48 hours prior to first CHG shower. It is OK to shave your face.  Please follow these instructions carefully.   1. Shower the NIGHT BEFORE SURGERY and the MORNING OF SURGERY with CHG.   2. If you chose to wash your hair, wash your hair first as usual with your normal shampoo.  3. After you shampoo, rinse your hair and body thoroughly to remove the shampoo.  4. Use CHG as you would any other liquid soap. You can apply CHG directly to the skin and wash gently with a scrungie or a clean washcloth.   5. Apply the CHG Soap to your body ONLY FROM THE NECK DOWN.  Do not use on open wounds or open sores. Avoid contact with your eyes, ears, mouth and genitals (private parts). Wash genitals (private parts) with your normal soap.  6. Wash thoroughly, paying special attention to the area where your surgery will be performed.  7. Thoroughly rinse your body with warm water from the neck down.  8. DO NOT shower/wash with your normal soap after using and rinsing off the CHG Soap.  9. Pat yourself dry with a CLEAN TOWEL.   10. Wear CLEAN PAJAMAS   11. Place CLEAN SHEETS on your bed the night of your first shower and DO NOT SLEEP WITH PETS.    Day of Surgery:  Do not apply any deodorants/lotions. Please wear clean clothes to the hospital/surgery center.      Please read over the following fact sheets that you were given. Pain Booklet, Coughing and Deep Breathing, MRSA Information and Surgical Site Infection Prevention

## 2017-02-14 NOTE — Progress Notes (Signed)
PCP: Dr. Blair Heysobert Ehinger Cardiologist: Dr. Jackey LogeP Jordan--last seen ~4 yrs ago per pt  EKG: Requested, pt reports had 1 week ago at PCP office, will need DOS if not received CXR: today, pending ECHO: 2007 Stress Test: 2010 Cardiac Cath: denies  Patient denies shortness of breath, fever, cough, and chest pain at PAT appointment.  Patient verbalized understanding of instructions provided today at the PAT appointment.  Patient asked to review instructions at home and day of surgery.

## 2017-02-15 ENCOUNTER — Encounter (INDEPENDENT_AMBULATORY_CARE_PROVIDER_SITE_OTHER): Payer: Self-pay | Admitting: Orthopedic Surgery

## 2017-02-15 ENCOUNTER — Ambulatory Visit (INDEPENDENT_AMBULATORY_CARE_PROVIDER_SITE_OTHER): Payer: Medicare Other | Admitting: Orthopedic Surgery

## 2017-02-15 VITALS — BP 128/84 | HR 84 | Temp 98.1°F | Resp 14 | Ht 65.0 in | Wt 178.0 lb

## 2017-02-15 DIAGNOSIS — M1711 Unilateral primary osteoarthritis, right knee: Secondary | ICD-10-CM | POA: Diagnosis not present

## 2017-02-15 LAB — URINE CULTURE: CULTURE: NO GROWTH

## 2017-02-15 NOTE — H&P (Addendum)
Kristen Campbell, MD   [name], PA-C [address] [place], [place]  [address]                             [phone]   ORTHOPAEDIC HISTORY & PHYSICAL  SOLYANA NONAKA MRN:  [id number]  CHIEF COMPLAINT:  Painful right Knee  HISTORY:injections Patient is a 76 y.o. female presented with a history of pain in the right knee for 2 years. Onset of symptoms was abrupt starting 1 year ago with rapidly worsening course since that time. Prior procedures on the knee are none. Patient has been treated conservatively with over-the-counter NSAIDs and activity modification cortisone . Patient currently rates pain in the knee at 8 out of 10 with activity. There is pain at night. present.  They have been previously treated with: NSAIDS: NSAID with mild improvement  Knee injection with corticosteroid  was performed Knee injection with visco supplementation was not performed Medications: NSAID with mild improvement  PAST MEDICAL HISTORY: Patient Active Problem List   Diagnosis Date Noted  . Unilateral primary osteoarthritis, left knee 01/05/2017  . Unilateral primary osteoarthritis, right knee 01/05/2017  . Atypical chest pain 05/22/2012   Past Medical History:  Diagnosis Date  . Acid reflux disease   . Arthritis   . Atypical chest pain   . Back pain   . Depression   . Gastroparesis   . H/O: hysterectomy   . Hiatal hernia   . Hypothyroidism   . Neck pain   . Self inflicted injury   . Sleep apnea    Past Surgical History:  Procedure Laterality Date  . ABDOMINAL HYSTERECTOMY    . COLONOSCOPY W/ BIOPSIES AND POLYPECTOMY    . NECK SURGERY     Metal plate in neck for degenerative disc disease     MEDICATIONS PRIOR TO ADMISSION: No current facility-administered medications for this encounter.   Current Outpatient Prescriptions:  .  albuterol (PROVENTIL HFA;VENTOLIN HFA) 108 (90 Base) MCG/ACT inhaler, Inhale into the lungs., Disp: , Rfl:  .  Glucos-Chondroit-Hyaluron-MSM  (GLUCOSAMINE CHONDROITIN JOINT PO), Take 1 tablet by mouth daily., Disp: , Rfl:  .  levothyroxine (SYNTHROID, LEVOTHROID) 75 MCG tablet, Take 75 mcg by mouth daily before breakfast. , Disp: , Rfl:  .  Multiple Vitamins-Minerals (MULTIVITAMIN WITH MINERALS) tablet, Take 1 tablet by mouth daily., Disp: , Rfl:  .  Turmeric 500 MG CAPS, Take 1 capsule by mouth daily., Disp: , Rfl:  .  ibuprofen (ADVIL,MOTRIN) 200 MG tablet, Take 200 mg by mouth every 6 (six) hours as needed for moderate pain., Disp: , Rfl:    ALLERGIES:   Allergies  Allergen Reactions  . Penicillins Hives    REVIEW OF SYSTEMS:  Review of Systems  Endo/Heme/Allergies:       Hypothyroid  All other systems reviewed and are negative.   FAMILY HISTORY:   Family History  Problem Relation Age of Onset  . Alzheimer's disease Mother   . Renal Disease Father     SOCIAL HISTORY:   Social History   Occupational History  . Not on file.   Social History Main Topics  . Smoking status: Never Smoker  . Smokeless tobacco: Never Used  . Alcohol use No  . Drug use: No  . Sexual activity: Not on file     EXAMINATION:  Vital signs in last 24 hours: There were no vitals taken for this visit.  Physical Exam  Constitutional: She is oriented to person, place, and time. She appears well-developed and well-nourished.  HENT:  Head: Normocephalic and atraumatic.  Eyes: Conjunctivae and EOM are normal. Pupils are equal, round, and reactive to light.  Neck: Neck supple.  No carotid bruits   Cardiovascular: Normal rate, regular rhythm, normal heart sounds and intact distal pulses.   Pulmonary/Chest: Effort normal and breath sounds normal.  Abdominal: Soft. Bowel sounds are normal. There is no tenderness.  Neurological: She is alert and oriented to person, place, and time.  Skin: Skin is warm and dry.  Psychiatric: She has a normal mood and affect. Her behavior is normal. Judgment and thought content normal.   Right Knee Exam     Tenderness  The patient is experiencing tenderness in the medial joint line.  Range of Motion  Extension: -5  Flexion: 100   Comments:  Opens with valgus stressing with a good endpoint. Crepitance with the femoral motion. Mild effusion      Imaging Review Plain radiographs demonstrate moderate degenerative joint disease of the right knee. The overall alignment is significant varus. The bone quality appears to be good for age and reported activity level.  ASSESSMENT: End stage arthritis, right knee  Past Medical History:  Diagnosis Date  . Acid reflux disease   . Arthritis   . Atypical chest pain   . Back pain   . Depression   . Gastroparesis   . H/O: hysterectomy   . Hiatal hernia   . Hypothyroidism   . Neck pain   . Self inflicted injury   . Sleep apnea     PLAN: Plan for right total knee replacement.  The patient history, physical examination and imaging studies are consistent with moderate degenerative joint disease of the right knee. The patient has failed conservative treatment.  The clearance notes were reviewed.  After discussion with the patient it was felt that Total Knee Replacement was indicated. The procedure,  risks, and benefits of total knee arthroplasty were presented and reviewed. The risks including but not limited to aseptic loosening, infection, blood clots, vascular and nerve injury, stiffness, patella tracking problems and fracture complications among others were discussed. The patient acknowledged the explanation, agreed to proceed with total knee replacement.  Oris DroneBrian D. Aleda Granaetrarca, PA-C [facility] [phone]  02/27/2017 11:07 AM

## 2017-02-15 NOTE — Progress Notes (Signed)
Kristen CampbellPeter Whitfield, MD   Kristen CodeBrian Ferdinand Revoir, PA-C [address], [place], [place]  [id number]                             [phone]   ORTHOPAEDIC HISTORY & PHYSICAL  Kristen MooresBarbara E Leonard MRN:  [id number] One n CHIEF COMPLAINT:  Painful right Knee  HISTORY:injections Patient is a 76 y.o. female presented with a history of pain in the right knee for 2 years. Onset of symptoms was abrupt starting 1 year ago with rapidly worsening course since that time. Prior procedures on the knee are none. Patient has been treated conservatively with over-the-counter NSAIDs and activity modification cortisone . Patient currently rates pain in the knee at 8 out of 10 with activity. There is pain at night. present.  They have been previously treated with: NSAIDS: NSAID with mild improvement  Knee injection with corticosteroid  was performed Knee injection with visco supplementation was not performed Medications: NSAID with mild improvement  PAST MEDICAL HISTORY: Patient Active Problem List   Diagnosis Date Noted  . Unilateral primary osteoarthritis, left knee 01/05/2017  . Unilateral primary osteoarthritis, right knee 01/05/2017  . Atypical chest pain 05/22/2012   Past Medical History:  Diagnosis Date  . Acid reflux disease   . Arthritis   . Atypical chest pain   . Back pain   . Depression   . Gastroparesis   . H/O: hysterectomy   . Hiatal hernia   . Hypothyroidism   . Neck pain   . Self inflicted injury   . Sleep apnea    Past Surgical History:  Procedure Laterality Date  . ABDOMINAL HYSTERECTOMY    . COLONOSCOPY W/ BIOPSIES AND POLYPECTOMY    . NECK SURGERY     Metal plate in neck for degenerative disc disease     MEDICATIONS PRIOR TO ADMISSION: No current facility-administered medications for this encounter.   Current Outpatient Prescriptions:  .  albuterol (PROVENTIL HFA;VENTOLIN HFA) 108 (90 Base) MCG/ACT inhaler, Inhale into the lungs., Disp: , Rfl:  .   Glucos-Chondroit-Hyaluron-MSM (GLUCOSAMINE CHONDROITIN JOINT PO), Take 1 tablet by mouth daily., Disp: , Rfl:  .  levothyroxine (SYNTHROID, LEVOTHROID) 75 MCG tablet, Take 75 mcg by mouth daily before breakfast. , Disp: , Rfl:  .  Multiple Vitamins-Minerals (MULTIVITAMIN WITH MINERALS) tablet, Take 1 tablet by mouth daily., Disp: , Rfl:  .  Turmeric 500 MG CAPS, Take 1 capsule by mouth daily., Disp: , Rfl:  .  ibuprofen (ADVIL,MOTRIN) 200 MG tablet, Take 200 mg by mouth every 6 (six) hours as needed for moderate pain., Disp: , Rfl:    ALLERGIES:   Allergies  Allergen Reactions  . Penicillins Hives    REVIEW OF SYSTEMS:  Review of Systems  Endo/Heme/Allergies:       Hypothyroid  All other systems reviewed and are negative.   FAMILY HISTORY:   Family History  Problem Relation Age of Onset  . Alzheimer's disease Mother   . Renal Disease Father     SOCIAL HISTORY:   Social History   Occupational History  . Not on file.   Social History Main Topics  . Smoking status: Never Smoker  . Smokeless tobacco: Never Used  . Alcohol use No  . Drug use: No  . Sexual activity: Not on file     EXAMINATION:  Vital signs in last 24 hours: There were no vitals taken for this visit.  Physical Exam  Constitutional: She is oriented to person, place, and time. She appears well-developed and well-nourished.  HENT:  Head: Normocephalic and atraumatic.  Eyes: Conjunctivae and EOM are normal. Pupils are equal, round, and reactive to light.  Neck: Neck supple.  No carotid bruits   Cardiovascular: Normal rate, regular rhythm, normal heart sounds and intact distal pulses.   Pulmonary/Chest: Effort normal and breath sounds normal.  Abdominal: Soft. Bowel sounds are normal. There is no tenderness.  Neurological: She is alert and oriented to person, place, and time.  Skin: Skin is warm and dry.  Psychiatric: She has a normal mood and affect. Her behavior is normal. Judgment and thought content  normal.   Right Knee Exam   Tenderness  The patient is experiencing tenderness in the medial joint line.  Range of Motion  Extension: -5  Flexion: 100   Comments:  Opens with valgus stressing with a good endpoint. Crepitance with the femoral motion. Mild effusion      Imaging Review Plain radiographs demonstrate moderate degenerative joint disease of the right knee. The overall alignment is significant varus. The bone quality appears to be good for age and reported activity level.  ASSESSMENT: End stage arthritis, right knee  Past Medical History:  Diagnosis Date  . Acid reflux disease   . Arthritis   . Atypical chest pain   . Back pain   . Depression   . Gastroparesis   . H/O: hysterectomy   . Hiatal hernia   . Hypothyroidism   . Neck pain   . Self inflicted injury   . Sleep apnea     PLAN: Plan for right total knee replacement.  The patient history, physical examination and imaging studies are consistent with moderate degenerative joint disease of the right knee. The patient has failed conservative treatment.  The clearance notes were reviewed.  After discussion with the patient it was felt that Total Knee Replacement was indicated. The procedure,  risks, and benefits of total knee arthroplasty were presented and reviewed. The risks including but not limited to aseptic loosening, infection, blood clots, vascular and nerve injury, stiffness, patella tracking problems and fracture complications among others were discussed. The patient acknowledged the explanation, agreed to proceed with total knee replacement.  Oris Drone Aleda Grana [facility] [phone]  02/15/2017 2:52 PM

## 2017-02-27 ENCOUNTER — Telehealth (INDEPENDENT_AMBULATORY_CARE_PROVIDER_SITE_OTHER): Payer: Self-pay | Admitting: Orthopaedic Surgery

## 2017-02-27 MED ORDER — ACETAMINOPHEN 10 MG/ML IV SOLN
1000.0000 mg | Freq: Once | INTRAVENOUS | Status: AC
  Administered 2017-02-28: 1000 mg via INTRAVENOUS
  Filled 2017-02-27: qty 100

## 2017-02-27 MED ORDER — VANCOMYCIN HCL IN DEXTROSE 1-5 GM/200ML-% IV SOLN
1000.0000 mg | INTRAVENOUS | Status: AC
  Administered 2017-02-28: 1000 mg via INTRAVENOUS
  Filled 2017-02-27: qty 200

## 2017-02-27 MED ORDER — TRANEXAMIC ACID 1000 MG/10ML IV SOLN
2000.0000 mg | INTRAVENOUS | Status: AC
  Administered 2017-02-28: 2000 mg via TOPICAL
  Filled 2017-02-27: qty 20

## 2017-02-27 MED ORDER — SODIUM CHLORIDE 0.9 % IV SOLN: INTRAVENOUS | Status: DC

## 2017-02-27 NOTE — Telephone Encounter (Signed)
Patient stopped by the office to pick up handicap tag form. Form was not up front. Patient will send daughter to pick it up when ready, after surgery. Please call patient when ready to pick up.

## 2017-02-28 ENCOUNTER — Encounter (HOSPITAL_COMMUNITY)
Admission: RE | Disposition: A | Discharge status: Home / Self Care | Payer: Self-pay | Source: Ambulatory Visit | Attending: Orthopaedic Surgery

## 2017-02-28 ENCOUNTER — Inpatient Hospital Stay (HOSPITAL_COMMUNITY): Payer: Medicare Other | Admitting: Anesthesiology

## 2017-02-28 ENCOUNTER — Encounter (HOSPITAL_COMMUNITY): Payer: Self-pay | Admitting: Anesthesiology

## 2017-02-28 ENCOUNTER — Inpatient Hospital Stay (HOSPITAL_COMMUNITY)
Admission: RE | Admit: 2017-02-28 | Discharge: 2017-03-02 | DRG: 470 | Disposition: A | Discharge status: Home / Self Care | Payer: Medicare Other | Source: Ambulatory Visit | Attending: Orthopaedic Surgery | Admitting: Orthopaedic Surgery

## 2017-02-28 DIAGNOSIS — Z96651 Presence of right artificial knee joint: Secondary | ICD-10-CM

## 2017-02-28 DIAGNOSIS — K219 Gastro-esophageal reflux disease without esophagitis: Secondary | ICD-10-CM | POA: Diagnosis present

## 2017-02-28 DIAGNOSIS — F329 Major depressive disorder, single episode, unspecified: Secondary | ICD-10-CM | POA: Diagnosis present

## 2017-02-28 DIAGNOSIS — Z90710 Acquired absence of both cervix and uterus: Secondary | ICD-10-CM | POA: Diagnosis not present

## 2017-02-28 DIAGNOSIS — M1711 Unilateral primary osteoarthritis, right knee: Secondary | ICD-10-CM | POA: Diagnosis present

## 2017-02-28 DIAGNOSIS — M542 Cervicalgia: Secondary | ICD-10-CM | POA: Diagnosis present

## 2017-02-28 HISTORY — PX: TOTAL KNEE ARTHROPLASTY: SHX125

## 2017-02-28 SURGERY — ARTHROPLASTY, KNEE, TOTAL
Anesthesia: Regional | Site: Knee | Laterality: Right

## 2017-02-28 MED ORDER — DIPHENHYDRAMINE HCL 12.5 MG/5ML PO ELIX: 12.5000 mg | ORAL_SOLUTION | ORAL | Status: DC | PRN

## 2017-02-28 MED ORDER — BUPIVACAINE-EPINEPHRINE (PF) 0.5% -1:200000 IJ SOLN
INTRAMUSCULAR | Status: DC | PRN
  Administered 2017-02-28: 30 mL via PERINEURAL

## 2017-02-28 MED ORDER — PHENYLEPHRINE HCL 10 MG/ML IJ SOLN
INTRAVENOUS | Status: DC | PRN
  Administered 2017-02-28: 25 ug/min via INTRAVENOUS

## 2017-02-28 MED ORDER — KETOROLAC TROMETHAMINE 15 MG/ML IJ SOLN
7.5000 mg | Freq: Four times a day (QID) | INTRAMUSCULAR | Status: AC
  Administered 2017-02-28 – 2017-03-01 (×4): 7.5 mg via INTRAVENOUS
  Filled 2017-02-28 (×4): qty 1

## 2017-02-28 MED ORDER — BUPIVACAINE IN DEXTROSE 0.75-8.25 % IT SOLN
INTRATHECAL | Status: DC | PRN
  Administered 2017-02-28: 1.7 mL via INTRATHECAL

## 2017-02-28 MED ORDER — LACTATED RINGERS IV SOLN
INTRAVENOUS | Status: DC
  Administered 2017-02-28 (×2): via INTRAVENOUS

## 2017-02-28 MED ORDER — DOCUSATE SODIUM 100 MG PO CAPS
100.0000 mg | ORAL_CAPSULE | Freq: Two times a day (BID) | ORAL | Status: DC
  Administered 2017-02-28 – 2017-03-02 (×5): 100 mg via ORAL
  Filled 2017-02-28 (×4): qty 1

## 2017-02-28 MED ORDER — ONDANSETRON HCL 4 MG/2ML IJ SOLN: 4.0000 mg | Freq: Four times a day (QID) | INTRAMUSCULAR | Status: DC | PRN

## 2017-02-28 MED ORDER — FENTANYL CITRATE (PF) 100 MCG/2ML IJ SOLN
INTRAMUSCULAR | Status: DC | PRN
  Administered 2017-02-28: 50 ug via INTRAVENOUS

## 2017-02-28 MED ORDER — ACETAMINOPHEN 10 MG/ML IV SOLN
1000.0000 mg | Freq: Four times a day (QID) | INTRAVENOUS | Status: AC
  Administered 2017-02-28 – 2017-03-01 (×4): 1000 mg via INTRAVENOUS
  Filled 2017-02-28 (×4): qty 100

## 2017-02-28 MED ORDER — METOCLOPRAMIDE HCL 5 MG PO TABS: 5.0000 mg | ORAL_TABLET | Freq: Three times a day (TID) | ORAL | Status: DC | PRN

## 2017-02-28 MED ORDER — MEPERIDINE HCL 25 MG/ML IJ SOLN: 6.2500 mg | INTRAMUSCULAR | Status: DC | PRN

## 2017-02-28 MED ORDER — PHENYLEPHRINE HCL 10 MG/ML IJ SOLN
INTRAMUSCULAR | Status: DC | PRN
  Administered 2017-02-28: 40 ug via INTRAVENOUS
  Administered 2017-02-28: 80 ug via INTRAVENOUS
  Administered 2017-02-28: 40 ug via INTRAVENOUS

## 2017-02-28 MED ORDER — ONDANSETRON HCL 4 MG PO TABS: 4.0000 mg | ORAL_TABLET | Freq: Four times a day (QID) | ORAL | Status: DC | PRN

## 2017-02-28 MED ORDER — ATROPINE SULFATE 0.4 MG/ML IJ SOLN
INTRAMUSCULAR | Status: DC | PRN
  Administered 2017-02-28: 0.4 mg via INTRAVENOUS

## 2017-02-28 MED ORDER — MENTHOL 3 MG MT LOZG: 1.0000 | LOZENGE | OROMUCOSAL | Status: DC | PRN

## 2017-02-28 MED ORDER — CHLORHEXIDINE GLUCONATE 4 % EX LIQD: 60.0000 mL | Freq: Once | CUTANEOUS | Status: DC

## 2017-02-28 MED ORDER — BISACODYL 10 MG RE SUPP: 10.0000 mg | Freq: Every day | RECTAL | Status: DC | PRN

## 2017-02-28 MED ORDER — VANCOMYCIN HCL IN DEXTROSE 1-5 GM/200ML-% IV SOLN
1000.0000 mg | Freq: Two times a day (BID) | INTRAVENOUS | Status: AC
  Administered 2017-02-28: 1000 mg via INTRAVENOUS
  Filled 2017-02-28: qty 200

## 2017-02-28 MED ORDER — MIDAZOLAM HCL 2 MG/2ML IJ SOLN: 1.0000 mg | Freq: Once | INTRAMUSCULAR | Status: DC

## 2017-02-28 MED ORDER — FENTANYL CITRATE (PF) 100 MCG/2ML IJ SOLN: 25.0000 ug | INTRAMUSCULAR | Status: DC | PRN

## 2017-02-28 MED ORDER — SODIUM CHLORIDE 0.9 % IV SOLN
INTRAVENOUS | Status: DC
  Administered 2017-02-28 – 2017-03-01 (×2): via INTRAVENOUS

## 2017-02-28 MED ORDER — PROPOFOL 500 MG/50ML IV EMUL
INTRAVENOUS | Status: DC | PRN
  Administered 2017-02-28: 25 ug/kg/min via INTRAVENOUS

## 2017-02-28 MED ORDER — METHOCARBAMOL 500 MG PO TABS
500.0000 mg | ORAL_TABLET | Freq: Four times a day (QID) | ORAL | Status: DC | PRN
  Administered 2017-02-28 – 2017-03-02 (×5): 500 mg via ORAL
  Filled 2017-02-28 (×5): qty 1

## 2017-02-28 MED ORDER — EPHEDRINE SULFATE 50 MG/ML IJ SOLN
INTRAMUSCULAR | Status: DC | PRN
  Administered 2017-02-28 (×2): 5 mg via INTRAVENOUS

## 2017-02-28 MED ORDER — POLYETHYLENE GLYCOL 3350 17 G PO PACK: 17.0000 g | PACK | Freq: Every day | ORAL | Status: DC | PRN

## 2017-02-28 MED ORDER — FENTANYL CITRATE (PF) 100 MCG/2ML IJ SOLN
INTRAMUSCULAR | Status: AC
  Administered 2017-02-28: 50 ug
  Filled 2017-02-28: qty 2

## 2017-02-28 MED ORDER — PHENOL 1.4 % MT LIQD: 1.0000 | OROMUCOSAL | Status: DC | PRN

## 2017-02-28 MED ORDER — PROPOFOL 10 MG/ML IV BOLUS
INTRAVENOUS | Status: AC
  Filled 2017-02-28: qty 20

## 2017-02-28 MED ORDER — RIVAROXABAN 10 MG PO TABS
10.0000 mg | ORAL_TABLET | Freq: Every day | ORAL | Status: DC
  Administered 2017-03-01 – 2017-03-02 (×2): 10 mg via ORAL
  Filled 2017-02-28 (×2): qty 1

## 2017-02-28 MED ORDER — OXYCODONE HCL 5 MG PO TABS
ORAL_TABLET | ORAL | Status: AC
  Filled 2017-02-28: qty 2

## 2017-02-28 MED ORDER — METOCLOPRAMIDE HCL 5 MG/ML IJ SOLN: 5.0000 mg | Freq: Three times a day (TID) | INTRAMUSCULAR | Status: DC | PRN

## 2017-02-28 MED ORDER — OXYCODONE HCL 5 MG PO TABS
5.0000 mg | ORAL_TABLET | ORAL | Status: DC | PRN
  Administered 2017-02-28 – 2017-03-01 (×4): 10 mg via ORAL
  Administered 2017-03-01 (×2): 5 mg via ORAL
  Administered 2017-03-02: 10 mg via ORAL
  Administered 2017-03-02: 5 mg via ORAL
  Filled 2017-02-28: qty 1
  Filled 2017-02-28 (×6): qty 2
  Filled 2017-02-28: qty 1

## 2017-02-28 MED ORDER — MIDAZOLAM HCL 2 MG/2ML IJ SOLN
INTRAMUSCULAR | Status: AC
  Administered 2017-02-28: 1 mg
  Filled 2017-02-28: qty 2

## 2017-02-28 MED ORDER — FENTANYL CITRATE (PF) 250 MCG/5ML IJ SOLN
INTRAMUSCULAR | Status: AC
  Filled 2017-02-28: qty 5

## 2017-02-28 MED ORDER — SODIUM CHLORIDE 0.9 % IR SOLN
Status: DC | PRN
  Administered 2017-02-28: 3000 mL

## 2017-02-28 MED ORDER — BUPIVACAINE-EPINEPHRINE 0.25% -1:200000 IJ SOLN
INTRAMUSCULAR | Status: DC | PRN
  Administered 2017-02-28: 30 mL

## 2017-02-28 MED ORDER — DEXTROSE 5 % IV SOLN
500.0000 mg | Freq: Four times a day (QID) | INTRAVENOUS | Status: DC | PRN
  Filled 2017-02-28: qty 5

## 2017-02-28 MED ORDER — HYDROMORPHONE HCL 1 MG/ML IJ SOLN
0.5000 mg | INTRAMUSCULAR | Status: DC | PRN
  Filled 2017-02-28: qty 0.5

## 2017-02-28 MED ORDER — LEVOTHYROXINE SODIUM 75 MCG PO TABS
75.0000 ug | ORAL_TABLET | Freq: Every day | ORAL | Status: DC
  Administered 2017-03-01 – 2017-03-02 (×2): 75 ug via ORAL
  Filled 2017-02-28 (×2): qty 1

## 2017-02-28 MED ORDER — METOCLOPRAMIDE HCL 5 MG/ML IJ SOLN: 10.0000 mg | Freq: Once | INTRAMUSCULAR | Status: DC | PRN

## 2017-02-28 MED ORDER — 0.9 % SODIUM CHLORIDE (POUR BTL) OPTIME
TOPICAL | Status: DC | PRN
  Administered 2017-02-28: 1000 mL

## 2017-02-28 MED ORDER — FENTANYL CITRATE (PF) 100 MCG/2ML IJ SOLN: 50.0000 ug | Freq: Once | INTRAMUSCULAR | Status: DC

## 2017-02-28 MED ORDER — ALUM & MAG HYDROXIDE-SIMETH 200-200-20 MG/5ML PO SUSP: 30.0000 mL | ORAL | Status: DC | PRN

## 2017-02-28 MED ORDER — ALBUTEROL SULFATE (2.5 MG/3ML) 0.083% IN NEBU: 3.0000 mL | INHALATION_SOLUTION | Freq: Four times a day (QID) | RESPIRATORY_TRACT | Status: DC | PRN

## 2017-02-28 MED ORDER — METHOCARBAMOL 500 MG PO TABS
ORAL_TABLET | ORAL | Status: AC
  Administered 2017-02-28: 500 mg via ORAL
  Filled 2017-02-28: qty 1

## 2017-02-28 MED ORDER — BUPIVACAINE-EPINEPHRINE (PF) 0.25% -1:200000 IJ SOLN
INTRAMUSCULAR | Status: AC
  Filled 2017-02-28: qty 30

## 2017-02-28 MED ORDER — GLYCOPYRROLATE 0.2 MG/ML IJ SOLN
INTRAMUSCULAR | Status: DC | PRN
  Administered 2017-02-28: 0.2 mg via INTRAVENOUS

## 2017-02-28 MED ORDER — MAGNESIUM CITRATE PO SOLN: 1.0000 | Freq: Once | ORAL | Status: DC | PRN

## 2017-02-28 MED ORDER — ONDANSETRON HCL 4 MG/2ML IJ SOLN
INTRAMUSCULAR | Status: DC | PRN
  Administered 2017-02-28: 4 mg via INTRAVENOUS

## 2017-02-28 SURGICAL SUPPLY — 61 items
BAG DECANTER FOR FLEXI CONT (MISCELLANEOUS) ×3 IMPLANT
BANDAGE ESMARK 6X9 LF (GAUZE/BANDAGES/DRESSINGS) ×1 IMPLANT
BLADE SAGITTAL 25.0X1.19X90 (BLADE) ×2 IMPLANT
BLADE SAGITTAL 25.0X1.19X90MM (BLADE) ×1
BLADE SURG 10 STRL SS (BLADE) ×3 IMPLANT
BNDG ESMARK 6X9 LF (GAUZE/BANDAGES/DRESSINGS) ×3
BOWL SMART MIX CTS (DISPOSABLE) ×3 IMPLANT
CAP KNEE TOTAL 3 SIGMA ×3 IMPLANT
CEMENT HV SMART SET (Cement) ×6 IMPLANT
COVER SURGICAL LIGHT HANDLE (MISCELLANEOUS) ×3 IMPLANT
CUFF TOURNIQUET SINGLE 34IN LL (TOURNIQUET CUFF) ×3 IMPLANT
CUFF TOURNIQUET SINGLE 44IN (TOURNIQUET CUFF) IMPLANT
DECANTER SPIKE VIAL GLASS SM (MISCELLANEOUS) IMPLANT
DRAPE EXTREMITY T 121X128X90 (DRAPE) ×3 IMPLANT
DRAPE HALF SHEET 40X57 (DRAPES) ×6 IMPLANT
DRSG ADAPTIC 3X8 NADH LF (GAUZE/BANDAGES/DRESSINGS) ×3 IMPLANT
DRSG PAD ABDOMINAL 8X10 ST (GAUZE/BANDAGES/DRESSINGS) ×3 IMPLANT
DURAPREP 26ML APPLICATOR (WOUND CARE) ×6 IMPLANT
ELECT CAUTERY BLADE 6.4 (BLADE) ×3 IMPLANT
ELECT REM PT RETURN 9FT ADLT (ELECTROSURGICAL) ×3
ELECTRODE REM PT RTRN 9FT ADLT (ELECTROSURGICAL) ×1 IMPLANT
EVACUATOR 1/8 PVC DRAIN (DRAIN) IMPLANT
FACESHIELD WRAPAROUND (MASK) ×9 IMPLANT
GAUZE SPONGE 4X4 12PLY STRL (GAUZE/BANDAGES/DRESSINGS) ×3 IMPLANT
GLOVE BIOGEL PI IND STRL 8 (GLOVE) ×1 IMPLANT
GLOVE BIOGEL PI IND STRL 8.5 (GLOVE) ×1 IMPLANT
GLOVE BIOGEL PI INDICATOR 8 (GLOVE) ×2
GLOVE BIOGEL PI INDICATOR 8.5 (GLOVE) ×2
GLOVE ECLIPSE 8.0 STRL XLNG CF (GLOVE) ×6 IMPLANT
GLOVE SURG ORTHO 8.5 STRL (GLOVE) ×6 IMPLANT
GOWN STRL REUS W/ TWL LRG LVL3 (GOWN DISPOSABLE) ×2 IMPLANT
GOWN STRL REUS W/TWL 2XL LVL3 (GOWN DISPOSABLE) ×3 IMPLANT
GOWN STRL REUS W/TWL LRG LVL3 (GOWN DISPOSABLE) ×4
HANDPIECE INTERPULSE COAX TIP (DISPOSABLE) ×2
KIT BASIN OR (CUSTOM PROCEDURE TRAY) ×3 IMPLANT
KIT ROOM TURNOVER OR (KITS) ×3 IMPLANT
MANIFOLD NEPTUNE II (INSTRUMENTS) ×3 IMPLANT
NEEDLE 22X1 1/2 (OR ONLY) (NEEDLE) ×3 IMPLANT
NS IRRIG 1000ML POUR BTL (IV SOLUTION) ×3 IMPLANT
PACK TOTAL JOINT (CUSTOM PROCEDURE TRAY) ×3 IMPLANT
PAD ARMBOARD 7.5X6 YLW CONV (MISCELLANEOUS) ×6 IMPLANT
PAD CAST 4YDX4 CTTN HI CHSV (CAST SUPPLIES) ×1 IMPLANT
PADDING CAST COTTON 4X4 STRL (CAST SUPPLIES) ×2
PADDING CAST COTTON 6X4 STRL (CAST SUPPLIES) ×3 IMPLANT
SET HNDPC FAN SPRY TIP SCT (DISPOSABLE) ×1 IMPLANT
STAPLER VISISTAT 35W (STAPLE) ×3 IMPLANT
SUCTION FRAZIER HANDLE 10FR (MISCELLANEOUS) ×2
SUCTION HEMOVAC SNY HYST (SUCTIONS) ×3 IMPLANT
SUCTION TUBE FRAZIER 10FR DISP (MISCELLANEOUS) ×1 IMPLANT
SURGIFLO W/THROMBIN 8M KIT (HEMOSTASIS) IMPLANT
SUT BONE WAX W31G (SUTURE) ×3 IMPLANT
SUT ETHIBOND NAB CT1 #1 30IN (SUTURE) ×6 IMPLANT
SUT MNCRL AB 3-0 PS2 18 (SUTURE) ×3 IMPLANT
SUT MNCRL AB 4-0 PS2 18 (SUTURE) IMPLANT
SUT VIC AB 0 CT1 27 (SUTURE) ×2
SUT VIC AB 0 CT1 27XBRD ANBCTR (SUTURE) ×1 IMPLANT
SYR CONTROL 10ML LL (SYRINGE) ×3 IMPLANT
TOWEL OR 17X24 6PK STRL BLUE (TOWEL DISPOSABLE) IMPLANT
TOWEL OR 17X26 10 PK STRL BLUE (TOWEL DISPOSABLE) IMPLANT
TRAY FOLEY BAG SILVER LF 16FR (SET/KITS/TRAYS/PACK) ×3 IMPLANT
WRAP KNEE MAXI GEL POST OP (GAUZE/BANDAGES/DRESSINGS) ×3 IMPLANT

## 2017-02-28 NOTE — Op Note (Signed)
PATIENT ID:      Kristen MooresBarbara E Leonard  MRN:     [id number] DOB/AGE:    76-Mar-1942 / 76 y.o.       OPERATIVE REPORT    DATE OF PROCEDURE:  02/28/2017       PREOPERATIVE DIAGNOSIS:END STAGE RIGHT KNEE OSTEOARTHRITIS                                                       Estimated body mass index is 29.62 kg/m as calculated from the following:   Height as of this encounter: 5\' 5"  (1.651 m).   Weight as of this encounter: 178 lb (80.7 kg).     POSTOPERATIVE DIAGNOSIS:END STAGE   RIGHT KNEE OSTEOARTHRITIS                                                                     Estimated body mass index is 29.62 kg/m as calculated from the following:   Height as of this encounter: 5\' 5"  (1.651 m).   Weight as of this encounter: 178 lb (80.7 kg).     PROCEDURE:  Procedure(s): RIGHT TOTAL KNEE ARTHROPLASTY      SURGEON:  Norlene CampbellPeter Neida Ellegood, MD    ASSISTANT:   Jacqualine CodeBrian Petrarca, PA-C   (Present and scrubbed throughout the case, critical for assistance with exposure, retraction, instrumentation, and closure.)          ANESTHESIA: regional and spinal     DRAINS: HEMOVAC DRAIN IN RIGHT KNEE-CLAMPED:      TOURNIQUET TIME:  Total Tourniquet Time Documented: Thigh (Right) - 65 minutes Total: Thigh (Right) - 65 minutes     COMPLICATIONS:  None   CONDITION:  stable  PROCEDURE IN DETAIL: [id number]   Kristen BatmanPeter W Jaylanie Leonard 02/28/2017, 12:32 PM

## 2017-02-28 NOTE — Anesthesia Preprocedure Evaluation (Signed)
Anesthesia Evaluation  Patient identified by MRN, date of birth, ID band Patient awake    Reviewed: Allergy & Precautions, NPO status , Patient's Chart, lab work & pertinent test results  Airway Mallampati: II  TM Distance: >3 FB Neck ROM: Full    Dental no notable dental hx.    Pulmonary sleep apnea ,    Pulmonary exam normal breath sounds clear to auscultation       Cardiovascular negative cardio ROS Normal cardiovascular exam Rhythm:Regular Rate:Normal     Neuro/Psych negative neurological ROS  negative psych ROS   GI/Hepatic Neg liver ROS, hiatal hernia, GERD  ,  Endo/Other  Hypothyroidism   Renal/GU negative Renal ROS  negative genitourinary   Musculoskeletal negative musculoskeletal ROS (+)   Abdominal   Peds negative pediatric ROS (+)  Hematology negative hematology ROS (+)   Anesthesia Other Findings   Reproductive/Obstetrics negative OB ROS                             Anesthesia Physical Anesthesia Plan  ASA: II  Anesthesia Plan: Spinal   Post-op Pain Management:  Regional for Post-op pain   Induction:   PONV Risk Score and Plan: 2 and Ondansetron and Dexamethasone  Airway Management Planned: Simple Face Mask  Additional Equipment:   Intra-op Plan:   Post-operative Plan:   Informed Consent: I have reviewed the patients History and Physical, chart, labs and discussed the procedure including the risks, benefits and alternatives for the proposed anesthesia with the patient or authorized representative who has indicated his/her understanding and acceptance.   Dental advisory given  Plan Discussed with:   Anesthesia Plan Comments:         Anesthesia Quick Evaluation

## 2017-02-28 NOTE — Discharge Instructions (Signed)

## 2017-02-28 NOTE — H&P (Signed)
The recent History & Physical has been reviewed. I have personally examined the patient today. There is no interval change to the documented History & Physical. The patient would like to proceed with the procedure.  [name] W Taegen Delker 02/28/2017,  9:59 AM

## 2017-02-28 NOTE — Anesthesia Postprocedure Evaluation (Signed)
Anesthesia Post Note  Patient: Marcello MooresBarbara E Pankratz  Procedure(s) Performed: Procedure(s) (LRB): RIGHT TOTAL KNEE ARTHROPLASTY (Right)     Patient location during evaluation: PACU Anesthesia Type: Regional and Spinal Level of consciousness: awake and alert Pain management: pain level controlled Vital Signs Assessment: post-procedure vital signs reviewed and stable Respiratory status: spontaneous breathing and respiratory function stable Cardiovascular status: blood pressure returned to baseline and stable Postop Assessment: no headache, no backache and spinal receding Anesthetic complications: no    Last Vitals:  Vitals:   02/28/17 1338 02/28/17 1401  BP: 117/77 122/74  Pulse: 82 80  Resp: 10   Temp:  36.4 C    Last Pain:  Vitals:   02/28/17 1401  TempSrc: Oral  PainSc:                  Phillips Groutarignan, Erhard Senske

## 2017-02-28 NOTE — Transfer of Care (Signed)
Immediate Anesthesia Transfer of Care Note  Patient: Marcello MooresBarbara E Fasching  Procedure(s) Performed: Procedure(s): RIGHT TOTAL KNEE ARTHROPLASTY (Right)  Patient Location: PACU  Anesthesia Type:Regional and Spinal  Level of Consciousness: awake, alert , oriented and sedated  Airway & Oxygen Therapy: Patient Spontanous Breathing and Patient connected to nasal cannula oxygen  Post-op Assessment: Report given to RN, Post -op Vital signs reviewed and stable and Patient moving all extremities  Post vital signs: Reviewed and stable  Last Vitals:  Vitals:   02/28/17 0950 02/28/17 1254  BP: 109/70   Pulse: 82   Resp: 15   Temp:  (P) 36.4 C    Last Pain:  Vitals:   02/28/17 1254  TempSrc:   PainSc: (P) 0-No pain      Patients Stated Pain Goal: 3 (02/28/17 0850)  Complications: No apparent anesthesia complications

## 2017-02-28 NOTE — Progress Notes (Signed)
Orthopedic Tech Progress Note Patient Details:  Kristen MooresBarbara E Leonard 09-Jul-1941 [id number]  Ortho Devices Ortho Device/Splint Location: applied cpm to pt right knee at 0-90 degrees.  provided overhead frame with trapeze for assistance.  pt tolerated application well.   Right knee/leg   Alvina ChouWilliams, Tiffane Sheldon C 02/28/2017, 1:36 PM

## 2017-02-28 NOTE — Telephone Encounter (Signed)
On you desk to be completed.

## 2017-02-28 NOTE — Evaluation (Signed)
Physical Therapy Evaluation Patient Details Name: [name] MRN: [id number] DOB: June 20, 1941 Today's Date: 02/28/2017   History of Present Illness  Pt is a 76 y/o female s/p elective R TKA secondary to R knee OA. PMH includes arthritis, depression, back pain, neck pain, and sleep apnea.   Clinical Impression  Pt s/p surgery above with deficits below. PTA, pt was using cane occasionally for ambulation. Upon evaluation, pt limited by post op pain and weakness, as well as decreased balance. Slight knee buckling during ambulation, and required verbal cues for maintenance of PWB. Required min to min guard assist throughout gait. Pt reports daughter will be available to assist as needed upon d/c home. Has all necessary DME and follow up PT per MD arrangements. Will continue to follow acutely to maximize functional mobility independence.     Follow Up Recommendations DC plan and follow up therapy as arranged by surgeon;Supervision/Assistance - 24 hour    Equipment Recommendations  None recommended by PT    Recommendations for Other Services       Precautions / Restrictions Precautions Precautions: Knee Precaution Booklet Issued: Yes (comment) Precaution Comments: Reviewed supine ther ex with pt.  Restrictions Weight Bearing Restrictions: Yes RLE Weight Bearing: Partial weight bearing RLE Partial Weight Bearing Percentage or Pounds: 50      Mobility  Bed Mobility Overal bed mobility: Needs Assistance Bed Mobility: Supine to Sit     Supine to sit: Min assist     General bed mobility comments: Min A for RLE management. Verbal cues for sequencing.   Transfers Overall transfer level: Needs assistance Equipment used: Rolling walker (2 wheeled) Transfers: Sit to/from Stand Sit to Stand: Min assist         General transfer comment: Min A for lift assist. Verbal cues for safe hand placement.   Ambulation/Gait Ambulation/Gait assistance: Min guard Ambulation Distance (Feet):  10 Feet Assistive device: Rolling walker (2 wheeled) Gait Pattern/deviations: Step-through pattern;Decreased step length - right;Decreased step length - left;Decreased weight shift to right;Antalgic Gait velocity: Decreased Gait velocity interpretation: Below normal speed for age/gender General Gait Details: Slow, antalgic gait. Min guard for safety. Required verbal cues for sequencing with RW. Pt with some knee buckling during ambulation and pain; distance limited. Verbal cues for appropriate use of UE during ambulation to maintain PWB.   Stairs            Wheelchair Mobility    Modified Rankin (Stroke Patients Only)       Balance Overall balance assessment: Needs assistance Sitting-balance support: No upper extremity supported;Feet supported Sitting balance-Leahy Scale: Good     Standing balance support: Bilateral upper extremity supported;During functional activity Standing balance-Leahy Scale: Poor Standing balance comment: Reliant on RW for stability                              Pertinent Vitals/Pain Pain Assessment: 0-10 Pain Score: 6  Pain Location: R knee Pain Descriptors / Indicators: Aching;Operative site guarding;Sore Pain Intervention(s): Limited activity within patient's tolerance;Monitored during session;Repositioned    Home Living Family/patient expects to be discharged to:: Private residence Living Arrangements: Children Available Help at Discharge: Family;Available 24 hours/day Type of Home: House Home Access: Stairs to enter Entrance Stairs-Rails:  (threshold step ) Entrance Stairs-Number of Steps: 1 Home Layout: Two level;Able to live on main level with bedroom/bathroom Home Equipment: Dan Humphreys - 2 wheels;Walker - 4 wheels;Bedside commode;Other (comment) (CPM)      Prior Function  Level of Independence: Independent with assistive device(s)         Comments: Used cane for ambulation      Hand Dominance   Dominant Hand: Right     Extremity/Trunk Assessment   Upper Extremity Assessment Upper Extremity Assessment: Defer to OT evaluation    Lower Extremity Assessment Lower Extremity Assessment: RLE deficits/detail RLE Deficits / Details: Reports numbness in calf. Deficits consistent with post op pain and weakness. Able to perform exercise below.     Cervical / Trunk Assessment Cervical / Trunk Assessment: Normal  Communication   Communication: No difficulties  Cognition Arousal/Alertness: Awake/alert Behavior During Therapy: WFL for tasks assessed/performed Overall Cognitive Status: Within Functional Limits for tasks assessed                                        General Comments General comments (skin integrity, edema, etc.): Pt's daughter present throughout session.     Exercises Total Joint Exercises Ankle Circles/Pumps: AROM;Both;10 reps;Supine Quad Sets: AROM;Right;10 reps;Supine Towel Squeeze: AROM;Both;10 reps;Supine Heel Slides: AROM;Right;10 reps;Supine Hip ABduction/ADduction: AROM;Right;10 reps;Supine   Assessment/Plan    PT Assessment Patient needs continued PT services  PT Problem List Decreased strength;Decreased range of motion;Decreased balance;Decreased mobility;Decreased knowledge of use of DME;Decreased knowledge of precautions;Pain       PT Treatment Interventions DME instruction;Gait training;Stair training;Functional mobility training;Therapeutic activities;Therapeutic exercise;Balance training;Neuromuscular re-education;Patient/family education    PT Goals (Current goals can be found in the Care Plan section)  Acute Rehab PT Goals Patient Stated Goal: to go home  PT Goal Formulation: With patient Time For Goal Achievement: 03/07/17 Potential to Achieve Goals: Good    Frequency 7X/week   Barriers to discharge        Co-evaluation               AM-PAC PT "6 Clicks" Daily Activity  Outcome Measure Difficulty turning over in bed (including  adjusting bedclothes, sheets and blankets)?: A Little Difficulty moving from lying on back to sitting on the side of the bed? : Total Difficulty sitting down on and standing up from a chair with arms (e.g., wheelchair, bedside commode, etc,.)?: Total Help needed moving to and from a bed to chair (including a wheelchair)?: A Little Help needed walking in hospital room?: A Little Help needed climbing 3-5 steps with a railing? : A Lot 6 Click Score: 13    End of Session Equipment Utilized During Treatment: Gait belt Activity Tolerance: Patient tolerated treatment well Patient left: in chair;with call bell/phone within reach;with family/visitor present Nurse Communication: Mobility status PT Visit Diagnosis: Other abnormalities of gait and mobility (R26.89);Pain Pain - Right/Left: Right Pain - part of body: Knee    Time: 1610-1630 PT Time Calculation (min) (ACUTE ONLY): 20 min   Charges:   PT Evaluation $PT Eval Low Complexity: 1 Procedure PT Treatments $Gait Training: 8-22 mins   PT G Codes:        Gladys DammeBrittany Raeley Gilmore, PT, DPT  Acute Rehabilitation Services  Pager: [phone]   Lehman PromBrittany S Nicanor Mendolia 02/28/2017, 5:45 PM

## 2017-02-28 NOTE — Anesthesia Procedure Notes (Signed)
Anesthesia Regional Block: Adductor canal block   Pre-Anesthetic Checklist: ,, timeout performed, Correct Patient, Correct Site, Correct Laterality, Correct Procedure, Correct Position, site marked, Risks and benefits discussed,  Surgical consent,  Pre-op evaluation,  At surgeon's request and post-op pain management  Laterality: Right and Lower  Prep: Maximum Sterile Barrier Precautions used, chloraprep       Needles:  Injection technique: Single-shot  Needle Type: Echogenic Stimulator Needle     Needle Length: 10cm      Additional Needles:   Procedures: ultrasound guided,,,,,,,,  Narrative:  Start time: 02/28/2017 9:36 AM End time: 02/28/2017 9:46 AM Injection made incrementally with aspirations every 5 mL.  Performed by: Personally  Anesthesiologist: Phillips GroutARIGNAN, Meghan Tiemann  Additional Notes: Risks, benefits and alternative to block explained extensively.  Patient tolerated procedure well, without complications.

## 2017-02-28 NOTE — Anesthesia Procedure Notes (Signed)
Spinal  Patient location during procedure: OR Staffing Anesthesiologist: Montez Hageman Performed: anesthesiologist  Preanesthetic Checklist Completed: patient identified, site marked, surgical consent, pre-op evaluation, timeout performed, IV checked, risks and benefits discussed and monitors and equipment checked Spinal Block Patient position: sitting Prep: Betadine Patient monitoring: heart rate, continuous pulse ox and blood pressure Approach: right paramedian Location: L4-5 Injection technique: single-shot Needle Needle type: Sprotte  Needle gauge: 24 G Needle length: 9 cm Additional Notes Expiration date of kit checked and confirmed. Patient tolerated procedure well, without complications.

## 2017-03-01 LAB — BASIC METABOLIC PANEL
ANION GAP: 3 — AB (ref 5–15)
BUN: 12 mg/dL (ref 6–20)
CALCIUM: 8.8 mg/dL — AB (ref 8.9–10.3)
CHLORIDE: 106 mmol/L (ref 101–111)
CO2: 27 mmol/L (ref 22–32)
Creatinine, Ser: 0.81 mg/dL (ref 0.44–1.00)
GFR calc non Af Amer: >60 mL/min (ref >60)
Glucose, Bld: 114 mg/dL — ABNORMAL HIGH (ref 65–99)
POTASSIUM: 4.3 mmol/L (ref 3.5–5.1)
Sodium: 136 mmol/L (ref 135–145)

## 2017-03-01 LAB — CBC
HEMATOCRIT: 32.6 % — AB (ref 36.0–46.0)
HEMOGLOBIN: 10.5 g/dL — AB (ref 12.0–15.0)
MCH: 29.7 pg (ref 26.0–34.0)
MCHC: 32.2 g/dL (ref 30.0–36.0)
MCV: 92.1 fL (ref 78.0–100.0)
Platelets: 175 10*3/uL (ref 150–400)
RBC: 3.54 MIL/uL — ABNORMAL LOW (ref 3.87–5.11)
RDW: 13.7 % (ref 11.5–15.5)
WBC: 6 10*3/uL (ref 4.0–10.5)

## 2017-03-01 NOTE — Progress Notes (Signed)
Physical Therapy Treatment Patient Details Name: Kristen MooresBarbara E Leonard MRN: [id number] DOB: 1940/11/02 Today's Date: 03/01/2017    History of Present Illness Pt is a 76 y/o female s/p elective R TKA secondary to R knee OA. PMH includes arthritis, depression, back pain, neck pain, and sleep apnea.     PT Comments    Pt performed increased gait during session this am.  Pt progressing well and will f/u in pm for ROM, strengthening and gait training.     Follow Up Recommendations  DC plan and follow up therapy as arranged by surgeon;Supervision/Assistance - 24 hour     Equipment Recommendations  None recommended by PT    Recommendations for Other Services       Precautions / Restrictions Precautions Precautions: Knee Precaution Booklet Issued: Yes (comment) Precaution Comments: Reviewed supine ther ex with pt.  Restrictions Weight Bearing Restrictions: Yes RLE Weight Bearing: Partial weight bearing RLE Partial Weight Bearing Percentage or Pounds: 50    Mobility  Bed Mobility Overal bed mobility: Needs Assistance Bed Mobility: Supine to Sit;Sit to Supine     Supine to sit: Min assist Sit to supine: Min assist   General bed mobility comments: Min A for RLE management. Verbal cues for sequencing.   Transfers Overall transfer level: Needs assistance Equipment used: Rolling walker (2 wheeled) Transfers: Sit to/from Stand Sit to Stand: Min guard         General transfer comment: Min guard assist. Verbal cues for safe hand placement.   Ambulation/Gait Ambulation/Gait assistance: Min guard Ambulation Distance (Feet): 140 Feet Assistive device: Rolling walker (2 wheeled) Gait Pattern/deviations: Step-through pattern;Decreased step length - right;Decreased step length - left;Decreased weight shift to right;Antalgic Gait velocity: Decreased Gait velocity interpretation: Below normal speed for age/gender General Gait Details: Slow, antalgic gait. Min guard for safety. Required  verbal cues for sequencing with RW. Pt with some knee buckling during ambulation and pain; distance limited. Verbal cues for appropriate use of UE during ambulation to maintain PWB.    Stairs            Wheelchair Mobility    Modified Rankin (Stroke Patients Only)       Balance Overall balance assessment: Needs assistance Sitting-balance support: No upper extremity supported;Feet supported Sitting balance-Leahy Scale: Good     Standing balance support: Bilateral upper extremity supported;During functional activity Standing balance-Leahy Scale: Poor Standing balance comment: Reliant on RW for stability                             Cognition Arousal/Alertness: Awake/alert Behavior During Therapy: WFL for tasks assessed/performed Overall Cognitive Status: Within Functional Limits for tasks assessed                                        Exercises Total Joint Exercises Ankle Circles/Pumps: AROM;Both;10 reps;Supine Quad Sets: AROM;Right;10 reps;Supine Towel Squeeze: AROM;Both;10 reps;Supine Short Arc Quad: AROM;Right;10 reps;Supine Heel Slides: AROM;Right;10 reps;Supine Hip ABduction/ADduction: AROM;Right;10 reps;Supine Straight Leg Raises: AROM;Right;10 reps;Supine Long Arc Quad: AROM;Right;10 reps;Supine Goniometric ROM: grossly 90 degrees R knee.      General Comments        Pertinent Vitals/Pain Pain Assessment: 0-10 Pain Score: 6  Pain Location: R knee Pain Descriptors / Indicators: Aching;Operative site guarding;Sore Pain Intervention(s): Monitored during session;Repositioned;Ice applied    Home Living  Prior Function            PT Goals (current goals can now be found in the care plan section) Acute Rehab PT Goals Patient Stated Goal: to go home  Potential to Achieve Goals: Good Progress towards PT goals: Progressing toward goals    Frequency    7X/week      PT Plan Current plan  remains appropriate    Co-evaluation              AM-PAC PT "6 Clicks" Daily Activity  Outcome Measure  Difficulty turning over in bed (including adjusting bedclothes, sheets and blankets)?: A Little Difficulty moving from lying on back to sitting on the side of the bed? : A Little Difficulty sitting down on and standing up from a chair with arms (e.g., wheelchair, bedside commode, etc,.)?: A Little Help needed moving to and from a bed to chair (including a wheelchair)?: A Little Help needed walking in hospital room?: A Little Help needed climbing 3-5 steps with a railing? : A Little 6 Click Score: 18    End of Session Equipment Utilized During Treatment: Gait belt Activity Tolerance: Patient tolerated treatment well Patient left: in chair;with call bell/phone within reach;with family/visitor present Nurse Communication: Mobility status PT Visit Diagnosis: Other abnormalities of gait and mobility (R26.89);Pain Pain - Right/Left: Right Pain - part of body: Knee     Time: 8119-1478 PT Time Calculation (min) (ACUTE ONLY): 23 min  Charges:  $Gait Training: 8-22 mins $Therapeutic Exercise: 8-22 mins                    G Codes:       Joycelyn Rua, PTA pager [phone]    Florestine Avers 03/01/2017, 1:18 PM

## 2017-03-01 NOTE — Progress Notes (Signed)
PATIENT ID: [name]        MRN:  [id number]          DOB/AGE: March 26, 1941 / 76 y.o.    Kristen CampbellPeter Whitfield, MD   Jacqualine CodeBrian Petrarca, PA-C [address] [place], [place]  [id number]                             [phone]   PROGRESS NOTE  Subjective:  negative for Chest Pain  negative for Shortness of Breath  negative for Nausea/Vomiting   negative for Calf Pain    Tolerating Diet: yes         Patient reports pain as mild.     Comfortable night-no complaints  Objective: Vital signs in last 24 hours:   Patient Vitals for the past 24 hrs:  BP Temp Temp src Pulse Resp SpO2 Height Weight  03/01/17 0601 110/68 98.8 F (37.1 C) Oral 71 18 99 % - -  03/01/17 0044 (!) 100/55 98.5 F (36.9 C) Oral 72 18 95 % - -  02/28/17 2146 110/63 98.5 F (36.9 C) Oral 77 18 100 % - -  02/28/17 1401 122/74 97.6 F (36.4 C) Oral 80 - 97 % - -  02/28/17 1338 117/77 - - 82 10 97 % - -  02/28/17 1323 120/71 - - 78 12 94 % - -  02/28/17 1308 115/76 - - 84 12 95 % - -  02/28/17 1254 114/64 97.6 F (36.4 C) - 88 14 100 % - -  02/28/17 0950 109/70 - - 82 15 96 % - -  02/28/17 0945 121/63 - - 75 15 98 % - -  02/28/17 0940 122/71 - - 67 11 98 % - -  02/28/17 0935 (!) 143/82 - - 69 10 100 % - -  02/28/17 0930 (!) 146/76 - - 65 18 99 % - -  02/28/17 0925 (!) 150/73 - - 69 17 99 % - -  02/28/17 0920 (!) 141/75 - - 68 19 98 % - -  02/28/17 0915 (!) 152/91 - - 70 14 97 % - -  02/28/17 0910 (!) 159/86 - - 69 20 98 % - -  02/28/17 0905 138/79 - - 71 13 98 % - -  02/28/17 0900 129/69 - - 72 15 98 % - -  02/28/17 0850 - - - - - - 5\' 5"  (1.651 m) 178 lb (80.7 kg)  02/28/17 0824 134/85 98.3 F (36.8 C) Oral 78 18 98 % - -      Intake/Output from previous day:   07/03 0701 - 07/04 0700 In: 1556.3 [I.V.:1456.3] Out: 1225 [Urine:850; Drains:350]   Intake/Output this shift:   No intake/output data recorded.   Intake/Output      07/03 0701 - 07/04 0700 07/04 0701 - 07/05 0700   I.V. (mL/kg) 1456.3 (18)     IV Piggyback 100    Total Intake(mL/kg) 1556.3 (19.3)    Urine (mL/kg/hr) 850    Drains 350    Blood 25    Total Output 1225     Net +331.3          Urine Occurrence 2 x       LABORATORY DATA:  Recent Labs  03/01/17 0605  WBC 6.0  HGB 10.5*  HCT 32.6*  PLT 175    Recent Labs  03/01/17 0605  NA 136  K 4.3  CL 106  CO2 27  BUN  12  CREATININE 0.81  GLUCOSE 114*  CALCIUM 8.8*   Lab Results  Component Value Date   INR 1.04 02/14/2017    Recent Radiographic Studies :  Dg Chest 2 View  Result Date: 02/14/2017 CLINICAL DATA:  Preoperative evaluation for total knee replacement EXAM: CHEST  2 VIEW COMPARISON:  Jan 23, 2014 FINDINGS: There is no edema or consolidation. Heart size and pulmonary vascularity are normal. No adenopathy. There is postoperative change in the lower cervical spine. There is degenerative change in the thoracic spine. IMPRESSION: No edema or consolidation. Electronically Signed   By: Bretta Bang III M.D.   On: 02/14/2017 16:10     Examination:  General appearance: alert, cooperative and no distress  Wound Exam: clean, dry, intact   Drainage:  Moderate amount Serosanguinous exudate In hemovac Motor Exam: EHL, FHL, Anterior Tibial and Posterior Tibial Intact  Sensory Exam:  normal  Vascular Exam: Normal  Assessment:    1 Day Post-Op  Procedure(s) (LRB): RIGHT TOTAL KNEE ARTHROPLASTY (Right)  ADDITIONAL DIAGNOSIS:  Active Problems:   S/P TKR (total knee replacement) using cement, right     Plan: Physical Therapy as ordered Partial Weight Bearing @ 50% (PWB)  DVT Prophylaxis:  Xarelto, Foot Pumps and TED hose  DISCHARGE PLAN: Home  DISCHARGE NEEDS: HHPT, CPM, Walker and 3-in-1 comode seat OOB with PT,D/C foley, D/C hemovac in am, hope for discharge in am       [name]  03/01/2017 7:39 AM  Patient ID: [name], female   DOB: 09-16-1940, 76 y.o.   MRN: [id number]

## 2017-03-01 NOTE — Care Management Note (Signed)
Case Management Note  Patient Details  Name: Marcello MooresBarbara E Mermelstein MRN: [id number] Date of Birth: [birth year]  Subjective/Objective:    76 yr old female s/p right total knee arthroplasty.                 Action/Plan: case manager spoke with patient concerning discharge plan and DME needs. Patient was preoperatively setup with [facility], no changes. She has RW and 3in1 at home. Patient will have support from her family at discharge.   Expected Discharge Date:  03/02/17               Expected Discharge Plan:  Home w Home Health Services  In-House Referral:  NA  Discharge planning Services  CM Consult  Post Acute Care Choice:  Home Health Choice offered to:  Patient  DME Arranged:  (S) N/A (has DME) DME Agency:  NA  HH Arranged:  PT HH Agency:  [facility] (formerly [facility])  Status of Service:  Completed, signed off  If discussed at [employer] Meetings, dates discussed:    Additional Comments:  Durenda GuthrieBrady, Tysen Roesler Naomi, RN 03/01/2017, 2:04 PM

## 2017-03-01 NOTE — Evaluation (Signed)
Occupational Therapy Evaluation Patient Details Name: [name] MRN: [id number] DOB: May 22, 1941 Today's Date: 03/01/2017    History of Present Illness Pt is a 76 y/o female s/p elective R TKA secondary to R knee OA. PMH includes arthritis, depression, back pain, neck pain, and sleep apnea.    Clinical Impression   Patient evaluated by Occupational Therapy with no further acute OT needs identified. All education has been completed and the patient has no further questions. See below for any follow-up Occupational Therapy or equipment needs. OT to sign off. Thank you for referral.      Follow Up Recommendations  No OT follow up    Equipment Recommendations  None recommended by OT    Recommendations for Other Services       Precautions / Restrictions Precautions Precautions: Knee Precaution Booklet Issued: Yes (comment) Precaution Comments: educated on knee extension Restrictions Weight Bearing Restrictions: Yes RLE Weight Bearing: Partial weight bearing RLE Partial Weight Bearing Percentage or Pounds: 50      Mobility Bed Mobility Overal bed mobility: Independent Bed Mobility: Supine to Sit;Sit to Supine     Supine to sit: Min assist Sit to supine: Min assist   General bed mobility comments: Min A for RLE management. Verbal cues for sequencing.   Transfers Overall transfer level: Independent Equipment used: Rolling walker (2 wheeled) Transfers: Sit to/from Stand Sit to Stand: Min guard         General transfer comment: Min guard assist. Verbal cues for safe hand placement.     Balance Overall balance assessment: Needs assistance Sitting-balance support: No upper extremity supported;Feet supported Sitting balance-Leahy Scale: Good     Standing balance support: Bilateral upper extremity supported;During functional activity Standing balance-Leahy Scale: Poor Standing balance comment: Reliant on RW for stability                            ADL  either performed or assessed with clinical judgement   ADL Overall ADL's : Independent                                       General ADL Comments: educated on car transfer, tub transfer, LB dressing sequence, bathing/ dressing  Pt educated on bathing and avoid washing directly on incision. Pt educated to use new wash cloth and towel each day. Pt educated to allow water to run across dressing and not to soak in a tub at this time. Sponge bath only until MD orders. No driving until MD clears. No bath tub ( can not submerge into the water)      Vision Baseline Vision/History: Wears glasses Wears Glasses: At all times       Perception     Praxis      Pertinent Vitals/Pain Pain Assessment: No/denies pain Pain Score: 6  Pain Location: R knee Pain Descriptors / Indicators: Aching;Operative site guarding;Sore Pain Intervention(s): Monitored during session;Repositioned;Ice applied     Hand Dominance Right   Extremity/Trunk Assessment Upper Extremity Assessment Upper Extremity Assessment: Overall WFL for tasks assessed   Lower Extremity Assessment Lower Extremity Assessment: Defer to PT evaluation   Cervical / Trunk Assessment Cervical / Trunk Assessment: Normal   Communication Communication Communication: No difficulties   Cognition Arousal/Alertness: Awake/alert Behavior During Therapy: WFL for tasks assessed/performed Overall Cognitive Status: Within Functional Limits for tasks assessed  General Comments  dressing dry and intact    Exercises Total Joint Exercises Ankle Circles/Pumps: AROM;Both;10 reps;Seated Quad Sets: AROM;Right;10 reps;Supine Towel Squeeze: AROM;Both;10 reps;Supine Short Arc Quad: AROM;Right;10 reps;Supine Heel Slides: AROM;Right;10 reps;Supine Hip ABduction/ADduction: AROM;Right;10 reps;Supine Straight Leg Raises: AROM;Right;10 reps;Supine Long Arc Quad: AROM;Right;10  reps;Supine Goniometric ROM: grossly 90 degrees R knee.     Shoulder Instructions      Home Living Family/patient expects to be discharged to:: Private residence Living Arrangements: Children Available Help at Discharge: Family;Available 24 hours/day Type of Home: House Home Access: Stairs to enter [employer] CorporationEntrance Stairs-Number of Steps: 1   Home Layout: Two level;Able to live on main level with bedroom/bathroom     Bathroom Shower/Tub: Tub/shower unit         Home Equipment: Walker - 2 wheels;Walker - 4 wheels;Bedside commode;Other (comment)   Additional Comments: has 2 cats in the home. daughter will keep 1 cat up stairs in spare room due to cat attempting to sleep with patient at night      Prior Functioning/Environment Level of Independence: Independent with assistive device(s)        Comments: Used cane for ambulation         OT Problem List:        OT Treatment/Interventions:      OT Goals(Current goals can be found in the care plan section) Acute Rehab OT Goals Patient Stated Goal: to go home   OT Frequency:     Barriers to D/C:            Co-evaluation              AM-PAC PT "6 Clicks" Daily Activity     Outcome Measure Help from another person eating meals?: None Help from another person taking care of personal grooming?: None Help from another person toileting, which includes using toliet, bedpan, or urinal?: None Help from another person bathing (including washing, rinsing, drying)?: None Help from another person to put on and taking off regular upper body clothing?: None Help from another person to put on and taking off regular lower body clothing?: None 6 Click Score: 24   End of Session Equipment Utilized During Treatment: Gait belt;Rolling walker Nurse Communication: Mobility status;Precautions  Activity Tolerance: Patient tolerated treatment well Patient left: in chair;with call bell/phone within reach;with nursing/sitter in room                    Time: 1204-1223 OT Time Calculation (min): 19 min Charges:  OT General Charges $OT Visit: 1 Procedure OT Evaluation $OT Eval Moderate Complexity: 1 Procedure G-Codes:      Mateo FlowJones, Brynn   OTR/L Pager: [phone] Office: [phone] .   Boone MasterJones, Dorothee Napierkowski B 03/01/2017, 1:44 PM

## 2017-03-01 NOTE — Progress Notes (Signed)
Physical Therapy Treatment Patient Details Name: [name] MRN: [id number] DOB: [birth year] Today's Date: 03/01/2017    History of Present Illness Pt is a 76 y/o female s/p elective R TKA secondary to R knee OA. PMH includes arthritis, depression, back pain, neck pain, and sleep apnea.     PT Comments    Pt performed gait and therapeutic exercise this pm.  Pt appears more guarded this afternoon with increased pain.  CP applied post tx.  Will plan for stair training in am before d/c home.     Follow Up Recommendations  DC plan and follow up therapy as arranged by surgeon;Supervision/Assistance - 24 hour     Equipment Recommendations  None recommended by PT    Recommendations for Other Services       Precautions / Restrictions Precautions Precautions: Knee Precaution Booklet Issued: Yes (comment) Precaution Comments: educated on knee extension Restrictions Weight Bearing Restrictions: Yes RLE Weight Bearing: Partial weight bearing RLE Partial Weight Bearing Percentage or Pounds: 50    Mobility  Bed Mobility Overal bed mobility: Independent Bed Mobility: Supine to Sit;Sit to Supine     Supine to sit: Supervision Sit to supine: Min assist   General bed mobility comments: Pt able to perform without assistance.    Transfers Overall transfer level: Needs assistance Equipment used: Rolling walker (2 wheeled) Transfers: Sit to/from Stand Sit to Stand: Min guard         General transfer comment: Min guard assist. Verbal cues for safe hand placement.   Ambulation/Gait Ambulation/Gait assistance: Min guard Ambulation Distance (Feet): 140 Feet Assistive device: Rolling walker (2 wheeled) Gait Pattern/deviations: Step-through pattern;Decreased step length - right;Decreased step length - left;Decreased weight shift to right;Antalgic;Step-to pattern;Trunk flexed Gait velocity: Decreased Gait velocity interpretation: Below normal speed for age/gender General Gait  Details: Cues to slow to step  to gait as patient reports increased pain when steping down.  Mod VCs for UE use to maintain PWB.  No buckling noted during gait training.  Cues for upper trunk control.     Stairs            Wheelchair Mobility    Modified Rankin (Stroke Patients Only)       Balance Overall balance assessment: Needs assistance Sitting-balance support: No upper extremity supported;Feet supported Sitting balance-Leahy Scale: Good     Standing balance support: Bilateral upper extremity supported;During functional activity Standing balance-Leahy Scale: Poor Standing balance comment: Reliant on RW for stability                             Cognition Arousal/Alertness: Awake/alert Behavior During Therapy: WFL for tasks assessed/performed Overall Cognitive Status: Within Functional Limits for tasks assessed                                        Exercises Total Joint Exercises Ankle Circles/Pumps: AROM;Both;10 reps;Seated Quad Sets: AROM;Right;10 reps;Supine Towel Squeeze: AROM;Both;10 reps;Supine Short Arc Quad: AROM;Right;10 reps;Supine Heel Slides: AROM;Right;10 reps;Supine Hip ABduction/ADduction: AROM;Right;10 reps;Supine Straight Leg Raises: AROM;Right;10 reps;Supine Long Arc Quad: AROM;Right;10 reps;Supine Goniometric ROM: grossly 90 degrees R knee.      General Comments General comments (skin integrity, edema, etc.): dressing dry and intact      Pertinent Vitals/Pain Pain Assessment: 0-10 Pain Score: 6  Pain Location: R knee Pain Descriptors / Indicators: Aching;Operative site guarding;Sore Pain Intervention(s): Monitored  during session;Repositioned    Home Living Family/patient expects to be discharged to:: Private residence Living Arrangements: Children Available Help at Discharge: Family;Available 24 hours/day Type of Home: House Home Access: Stairs to enter   Home Layout: Two level;Able to live on main  level with bedroom/bathroom Home Equipment: Dan Humphreys - 2 wheels;Walker - 4 wheels;Bedside commode;Other (comment) Additional Comments: has 2 cats in the home. daughter will keep 1 cat up stairs in spare room due to cat attempting to sleep with patient at night    Prior Function Level of Independence: Independent with assistive device(s)      Comments: Used cane for ambulation    PT Goals (current goals can now be found in the care plan section) Acute Rehab PT Goals Patient Stated Goal: to go home  Potential to Achieve Goals: Good Progress towards PT goals: Progressing toward goals    Frequency    7X/week      PT Plan Current plan remains appropriate    Co-evaluation              AM-PAC PT "6 Clicks" Daily Activity  Outcome Measure  Difficulty turning over in bed (including adjusting bedclothes, sheets and blankets)?: A Little Difficulty moving from lying on back to sitting on the side of the bed? : A Little Difficulty sitting down on and standing up from a chair with arms (e.g., wheelchair, bedside commode, etc,.)?: A Little Help needed moving to and from a bed to chair (including a wheelchair)?: A Little Help needed walking in hospital room?: A Little Help needed climbing 3-5 steps with a railing? : A Little 6 Click Score: 18    End of Session Equipment Utilized During Treatment: Gait belt Activity Tolerance: Patient tolerated treatment well Patient left: in chair;with call bell/phone within reach;with family/visitor present Nurse Communication: Mobility status PT Visit Diagnosis: Other abnormalities of gait and mobility (R26.89);Pain Pain - Right/Left: Right Pain - part of body: Knee     Time: 1523-1550 PT Time Calculation (min) (ACUTE ONLY): 27 min  Charges:  $Gait Training: 8-22 mins $Therapeutic Exercise: 8-22 mins                    G Codes:       Joycelyn Rua, PTA pager [phone]    Florestine Avers 03/01/2017, 3:58 PM

## 2017-03-02 ENCOUNTER — Encounter (HOSPITAL_COMMUNITY): Payer: Self-pay | Admitting: Orthopaedic Surgery

## 2017-03-02 LAB — CBC
HCT: 31.4 % — ABNORMAL LOW (ref 36.0–46.0)
Hemoglobin: 10.3 g/dL — ABNORMAL LOW (ref 12.0–15.0)
MCH: 29.5 pg (ref 26.0–34.0)
MCHC: 32.8 g/dL (ref 30.0–36.0)
MCV: 90 fL (ref 78.0–100.0)
Platelets: 171 10*3/uL (ref 150–400)
RBC: 3.49 MIL/uL — ABNORMAL LOW (ref 3.87–5.11)
RDW: 13.6 % (ref 11.5–15.5)
WBC: 6.7 10*3/uL (ref 4.0–10.5)

## 2017-03-02 LAB — BASIC METABOLIC PANEL
Anion gap: 5 (ref 5–15)
BUN: 10 mg/dL (ref 6–20)
CALCIUM: 8.7 mg/dL — AB (ref 8.9–10.3)
CHLORIDE: 104 mmol/L (ref 101–111)
CO2: 25 mmol/L (ref 22–32)
CREATININE: 0.74 mg/dL (ref 0.44–1.00)
GFR calc non Af Amer: >60 mL/min (ref >60)
Glucose, Bld: 140 mg/dL — ABNORMAL HIGH (ref 65–99)
Potassium: 3.6 mmol/L (ref 3.5–5.1)
SODIUM: 134 mmol/L — AB (ref 135–145)

## 2017-03-02 MED ORDER — OXYCODONE HCL 5 MG PO TABS: 5.0000 mg | ORAL_TABLET | ORAL | 0 refills | Status: DC | PRN

## 2017-03-02 MED ORDER — METHOCARBAMOL 500 MG PO TABS: 500.0000 mg | ORAL_TABLET | Freq: Four times a day (QID) | ORAL | 0 refills | Status: DC | PRN

## 2017-03-02 MED ORDER — RIVAROXABAN 10 MG PO TABS: 10.0000 mg | ORAL_TABLET | Freq: Every day | ORAL | 0 refills | Status: DC

## 2017-03-02 NOTE — Progress Notes (Signed)
pt ready for discharge from unit to home. D/C instructions reviewed with pt.  No distress noted, no s/sx of distress noted, no c/o. Rx with pt as are all personal belongings.

## 2017-03-02 NOTE — Op Note (Signed)
NAME:  VERGENE, MARLAND                     ACCOUNT NO.:  MEDICAL RECORD NO.:  [id number]  LOCATION:                                 FACILITY:  PHYSICIAN:  Claude Manges. Ariyanna Oien, M.D.DATE OF BIRTH:  Apr 02, 1941  DATE OF PROCEDURE:  02/28/2017 DATE OF DISCHARGE:                              OPERATIVE REPORT   PREOPERATIVE DIAGNOSIS:  End-stage osteoarthritis, right knee.  POSTOPERATIVE DIAGNOSIS:  End-stage osteoarthritis, right knee.  PROCEDURE:  Right total knee replacement.  SURGEON:  Claude Manges. Cleophas Dunker, M.D.  ASSISTANT:  [name], [facility], was present throughout the operative procedure to ensure its timely completion.  ANESTHESIA:  Spinal with supplemental adductor canal block.  COMPLICATIONS:  None.  COMPONENTS:  DePuy LCS standard femoral component, a #3 rotating keeled tibial tray with a 12.5-mm polyethylene bridging bearing, a 3-peg metal back rotating patella.  Components were secured with polymethyl methacrylate without antibiotics.  DESCRIPTION OF PROCEDURE:  Ms. Klahr was met with her family in the holding area, identified the right knee as appropriate operative site and marked it accordingly.  Anesthesia performed an adductor canal block.  The patient was then transported to room #7 and spinal anesthesia provided by Anesthesia without difficulty.  Nursing staff inserted a Foley catheter.  Urine was clear.  Right lower extremity was then placed in a thigh tourniquet.  The right lower extremity was then prepped with chlorhexidine scrub and DuraPrep x2 from the tourniquet to the tips of the toes.  Sterile draping was performed.  Time-out was called.  The right lower extremity was then Esmarch exsanguinated with a proximal tourniquet at 350 mmHg.  A midline longitudinal incision was made, centered about the patella, extending from the superior pouch to the tibial tubercle.  Via sharp dissection, incision carried down to subcutaneous tissue.  First layer of capsule  was incised in midline and medial parapatellar incision was then made with the Bovie.  The joint was entered.  There was a clear yellow joint effusion about 20 to 25 mL in volume.  The patella was everted 180 degrees laterally, the knee flexed to 90 degrees.  There was complete absence of articular cartilage on the medial femoral condyle and medial tibial plateau with osteophytes on both medial and lateral femoral condyles and predominantly on the medial tibial plateau.  These were removed.  There was a fixed flexion contracture preoperatively. There was a moderate amount of beefy red synovitis, Synovectomy was performed.  I measured a standard femoral component.  First, bony cut was then made transversely in the proximal tibia with a 7-degree angle of declination.  After each bony cut on the tibia and femur, I checked my alignment with an external guide.  Cuts were then made on the femur using the standard femoral jig.  I used a 4-degree distal femoral valgus cut.  Lamina spreaders were then inserted into the medial and lateral compartments and removed medial and lateral menisci, ACL and PCL.  There were no loose bodies.  I removed osteophytes in the posterior femoral condyles, both medially and laterally.  Flexion and extension gaps were perfectly symmetrical at 12.5 mm.  I did not see a popliteal  cyst. Final cuts were then made on the tibia for tapering cuts into the center holes.  Retractors were then placed about the tibia, was advanced anteriorly and measured a #3 tibial tray.  This was pinned in place.  Made sure it was perfectly aligned with the guide.  Center hole was then made by the keeled cut.  With the tibial jig in place, I used 12.5 mm bridging bearing.  This was applied, followed by the standard femoral component. The trials were then reduced and through a full range of motion was perfectly stable and at full extension, no opening with varus or valgus stress.  The  patella was prepared by removing 10 mm of bone leaving 12 mm of patella thickness.  Three holes made.  Trial patella applied and reduced and through a full range of motion, it was stable.  Trial components were then removed.  The joint was copiously irrigated with saline solution.  The final components were then impacted with polymethyl methacrylate.  We initially applied the tibia followed by the trial 12.5 mm polyethylene bridging bearing and then, the final femoral component.  Extraneous methacrylate was removed from the periphery of the components.  Compression was applied.  Patella was applied with a patellar clamp.  At approximately 16 minutes, the methacrylate had matured.  During this time, we injected the joint with 0.25% Marcaine without epinephrine and irrigated the joint.  Then, the trial tibial tray, the tibial polyethylene component was removed and extraneous hardened methacrylate was removed with an osteotome.  We then irrigated the joint, applied the final 12.5-mm polyethylene bridging bearing and reduced it without difficulty.  Tourniquet was deflated at 66 minutes. Gross bleeders were Bovie coagulated.  We applied topical tranexamic acid.  Medium-sized Hemovac was placed through the lateral compartment and clamped.  We had a nice dry field.  The capsule was then closed with running #1 Ethibond suture.  Subcu closed with 2-0 Vicryl, subcu with 3- 0 Monocryl.  Skin closed with skin clips.  Sterile bulky dressing was applied followed by the patient's support stocking.  The patient tolerated the procedure without complications.     Vonna Kotyk. Durward Fortes, M.D.     PWW/MEDQ  D:  02/28/2017  T:  02/28/2017  Job:  [id number]

## 2017-03-02 NOTE — Progress Notes (Addendum)
Physical Therapy Treatment Patient Details Name: [name] MRN: [id number] DOB: 11/25/1940 Today's Date: 03/02/2017    History of Present Illness Pt is a 76 y/o female s/p elective R TKA secondary to R knee OA. PMH includes arthritis, depression, back pain, neck pain, and sleep apnea.     PT Comments    Pt performed d/c gait this pm due to fatigue.  Pt remains to progress well requiring decreased assistance during mobility.  Pt performed transfers from different surfaces with good technique.  Pt placed in CPM post tx.  Informed RN that patient is ready for d/c home.     Follow Up Recommendations  DC plan and follow up therapy as arranged by surgeon;Supervision/Assistance - 24 hour     Equipment Recommendations       Recommendations for Other Services       Precautions / Restrictions Precautions Precautions: Knee Precaution Booklet Issued: Yes (comment) Precaution Comments: educated on knee extension Restrictions Weight Bearing Restrictions: Yes RLE Weight Bearing: Partial weight bearing RLE Partial Weight Bearing Percentage or Pounds: 50    Mobility  Bed Mobility Overal bed mobility: Modified Independent Bed Mobility: Supine to Sit;Sit to Supine     Supine to sit: Modified independent (Device/Increase time) Sit to supine: Modified independent (Device/Increase time)   General bed mobility comments: Pt able to perform without assistance.    Transfers Overall transfer level: Needs assistance Equipment used: Rolling walker (2 wheeled) Transfers: Sit to/from Stand Sit to Stand: Supervision         General transfer comment: Cues for hand placement to and from seated surface   Ambulation/Gait Ambulation/Gait assistance: Supervision Ambulation Distance (Feet): 120 Feet (decreased gait due to fatigue.  ) Assistive device: Rolling walker (2 wheeled) Gait Pattern/deviations: Decreased weight shift to right;Antalgic;Step-to pattern;Trunk flexed;Decreased stride  length Gait velocity: Decreased Gait velocity interpretation: Below normal speed for age/gender General Gait Details: Cues for upper trunk control, sequencing, use of UEs and gait symmetry.     Stairs  Wheelchair Mobility    Modified Rankin (Stroke Patients Only)       Balance Overall balance assessment: Needs assistance Sitting-balance support: No upper extremity supported;Feet supported Sitting balance-Leahy Scale: Good     Standing balance support: Bilateral upper extremity supported;During functional activity Standing balance-Leahy Scale: Poor Standing balance comment: Reliant on RW for stability                             Cognition Arousal/Alertness: Awake/alert Behavior During Therapy: WFL for tasks assessed/performed Overall Cognitive Status: Within Functional Limits for tasks assessed                                        Exercises    General Comments        Pertinent Vitals/Pain Pain Assessment: 0-10 Pain Score: 2  Pain Location: R knee Pain Descriptors / Indicators: Aching;Operative site guarding;Sore Pain Intervention(s): Monitored during session;Repositioned    Home Living                      Prior Function            PT Goals (current goals can now be found in the care plan section) Acute Rehab PT Goals Patient Stated Goal: to go home  Potential to Achieve Goals: Good Progress towards PT goals: Progressing toward  goals    Frequency    7X/week      PT Plan Current plan remains appropriate    Co-evaluation              AM-PAC PT "6 Clicks" Daily Activity  Outcome Measure  Difficulty turning over in bed (including adjusting bedclothes, sheets and blankets)?: None Difficulty moving from lying on back to sitting on the side of the bed? : None Difficulty sitting down on and standing up from a chair with arms (e.g., wheelchair, bedside commode, etc,.)?: A Little Help needed moving to and  from a bed to chair (including a wheelchair)?: A Little Help needed walking in hospital room?: A Little Help needed climbing 3-5 steps with a railing? : A Little 6 Click Score: 20    End of Session Equipment Utilized During Treatment: Gait belt Activity Tolerance: Patient tolerated treatment well Patient left: in chair;with call bell/phone within reach;with family/visitor present Nurse Communication: Mobility status PT Visit Diagnosis: Other abnormalities of gait and mobility (R26.89);Pain Pain - Right/Left: Right Pain - part of body: Knee     Time: [id number] PT Time Calculation (min) (ACUTE ONLY): 19 min  Charges:  $Gait Training: 8-22 mins                    G Codes:      Joycelyn RuaAimee Lourdes Kucharski, PTA pager [phone]    Florestine AversAimee J Ashir Kunz 03/02/2017, 5:43 PM

## 2017-03-02 NOTE — Progress Notes (Addendum)
Physical Therapy Treatment Patient Details Name: Marcello MooresBarbara E Lehane MRN: [id number] DOB: Feb 25, 1941 Today's Date: 03/02/2017    History of Present Illness Pt is a 76 y/o female s/p elective R TKA secondary to R knee OA. PMH includes arthritis, depression, back pain, neck pain, and sleep apnea.     PT Comments    Pt performed gait and stair training in prep for d/c home this afternoon.  Pt reviewed and completed HEP this am.  Plan to continue to progress mobility this pm.     Follow Up Recommendations  DC plan and follow up therapy as arranged by surgeon;Supervision/Assistance - 24 hour     Equipment Recommendations       Recommendations for Other Services       Precautions / Restrictions Precautions Precautions: Knee Precaution Booklet Issued: Yes (comment) Precaution Comments: educated on knee extension Restrictions Weight Bearing Restrictions: Yes RLE Weight Bearing: Partial weight bearing RLE Partial Weight Bearing Percentage or Pounds: 50    Mobility  Bed Mobility Overal bed mobility: Modified Independent Bed Mobility: Supine to Sit;Sit to Supine     Supine to sit: Modified independent (Device/Increase time) Sit to supine: Modified independent (Device/Increase time)   General bed mobility comments: Pt able to perform without assistance.    Transfers Overall transfer level: Needs assistance Equipment used: Rolling walker (2 wheeled) Transfers: Sit to/from Stand Sit to Stand: Supervision         General transfer comment: Cues for hand placement to and from seated surface   Ambulation/Gait Ambulation/Gait assistance: Min guard Ambulation Distance (Feet): 250 Feet Assistive device: Rolling walker (2 wheeled) Gait Pattern/deviations: Decreased weight shift to right;Antalgic;Step-to pattern;Trunk flexed;Decreased stride length Gait velocity: Decreased Gait velocity interpretation: Below normal speed for age/gender General Gait Details: Cues to keep RW still when  stepping assist with RW early on during tx but progressed to min guard.     Stairs Stairs: Yes   Stair Management: No rails;Backwards Number of Stairs: 2 General stair comments: Pt performed curb step backwards x2 with cues for sequencing and RW placement.    Wheelchair Mobility    Modified Rankin (Stroke Patients Only)       Balance Overall balance assessment: Needs assistance Sitting-balance support: No upper extremity supported;Feet supported Sitting balance-Leahy Scale: Good       Standing balance-Leahy Scale: Poor Standing balance comment: Reliant on RW for stability                             Cognition Arousal/Alertness: Awake/alert Behavior During Therapy: WFL for tasks assessed/performed Overall Cognitive Status: Within Functional Limits for tasks assessed                                        Exercises Total Joint Exercises Ankle Circles/Pumps: AROM;Both;10 reps;Seated Quad Sets: AROM;Right;10 reps;Supine Towel Squeeze: AROM;Both;10 reps;Supine Short Arc Quad: AROM;Right;10 reps;Supine Heel Slides: AROM;Right;10 reps;Supine Hip ABduction/ADduction: AROM;Right;10 reps;Supine Straight Leg Raises: AROM;Right;10 reps;Supine Long Arc Quad: AROM;Right;10 reps;Supine Goniometric ROM: 78 degrees flexion in R knee.      General Comments        Pertinent Vitals/Pain Pain Assessment: 0-10 Pain Score: 3  Pain Location: R knee Pain Descriptors / Indicators: Aching;Operative site guarding;Sore Pain Intervention(s): Monitored during session;Repositioned    Home Living  Prior Function            PT Goals (current goals can now be found in the care plan section) Acute Rehab PT Goals Patient Stated Goal: to go home  Potential to Achieve Goals: Good Progress towards PT goals: Progressing toward goals    Frequency    7X/week      PT Plan Current plan remains appropriate    Co-evaluation               AM-PAC PT "6 Clicks" Daily Activity  Outcome Measure  Difficulty turning over in bed (including adjusting bedclothes, sheets and blankets)?: None Difficulty moving from lying on back to sitting on the side of the bed? : None Difficulty sitting down on and standing up from a chair with arms (e.g., wheelchair, bedside commode, etc,.)?: A Little Help needed moving to and from a bed to chair (including a wheelchair)?: A Little Help needed walking in hospital room?: A Little Help needed climbing 3-5 steps with a railing? : A Little 6 Click Score: 20    End of Session Equipment Utilized During Treatment: Gait belt Activity Tolerance: Patient tolerated treatment well Patient left: in chair;with call bell/phone within reach;with family/visitor present Nurse Communication: Mobility status PT Visit Diagnosis: Other abnormalities of gait and mobility (R26.89);Pain Pain - Right/Left: Right Pain - part of body: Knee     Time: 1118-1140 PT Time Calculation (min) (ACUTE ONLY): 22 min  Charges:  $Gait Training: 8-22 mins                    G Codes:       Joycelyn Rua, PTA pager [phone]    Florestine Avers 03/02/2017, 5:36 PM

## 2017-03-02 NOTE — Discharge Summary (Signed)
[name], MD   [name], PA-C [address] [place], [place]  [address]                             [phone]  PATIENT ID: [name]        MRN:  [id number]          DOB/AGE: 76/21/1942 / 76 y.o.    DISCHARGE SUMMARY  ADMISSION DATE:    02/28/2017 DISCHARGE DATE:   03/02/2017   ADMISSION DIAGNOSIS: RIGHT KNEE OSTEOARTHRITIS    DISCHARGE DIAGNOSIS:  RIGHT KNEE OSTEOARTHRITIS    ADDITIONAL DIAGNOSIS: Active Problems:   S/P TKR (total knee replacement) using cement, right  Past Medical History:  Diagnosis Date  . Acid reflux disease   . Arthritis   . Atypical chest pain   . Back pain   . Depression   . Gastroparesis   . H/O: hysterectomy   . Hiatal hernia   . Hypothyroidism   . Neck pain   . Self inflicted injury   . Sleep apnea     PROCEDURE: Procedure(s): RIGHT TOTAL KNEE ARTHROPLASTY Right on 02/28/2017  CONSULTS: none    HISTORY: Patient is a 76 y.o. female presented with a history of pain in the right knee for 2 years. Onset of symptoms was abrupt starting 1 year ago with rapidly worsening course since that time. Prior procedures on the knee are none. Patient has been treated conservatively with over-the-counter NSAIDs and activity modification cortisone . Patient currently rates pain in the knee at 8 out of 10 with activity. There is pain at night. present.  HOSPITAL COURSE:  [name] is a 76 y.o. admitted on 02/28/2017 and found to have a diagnosis of RIGHT KNEE OSTEOARTHRITIS.  After appropriate laboratory studies were obtained  they were taken to the operating room on 02/28/2017 and underwent  Procedure(s): RIGHT TOTAL KNEE ARTHROPLASTY  .   They were given perioperative antibiotics:  Anti-infectives    Start     Dose/Rate Route Frequency Ordered Stop   02/28/17 2100  vancomycin (VANCOCIN) IVPB 1000 mg/200 mL premix     1,000 mg 200 mL/hr over 60 Minutes Intravenous Every 12 hours 02/28/17 1400 02/28/17 2247   02/28/17 0930  vancomycin  (VANCOCIN) IVPB 1000 mg/200 mL premix     1,000 mg 200 mL/hr over 60 Minutes Intravenous To ShortStay Surgical 02/27/17 0926 02/28/17 1031    .  Tolerated the procedure well.  Placed with a foley intraoperatively.     Toradol was given post op.  POD #1, allowed out of bed to a chair.  PT for ambulation and exercise program.  Foley D/C'd in morning.  IV saline locked.  O2 discontionued.  POD #2, continued PT and ambulation.  Hemovac pulled. .  The remainder of the hospital course was dedicated to ambulation and strengthening.   The patient was discharged on 2 Days Post-Op in  Stable condition.  Blood products given:none  DIAGNOSTIC STUDIES: Recent vital signs: Patient Vitals for the past 24 hrs:  BP Temp Temp src Pulse Resp SpO2  03/02/17 1300 139/72 99.8 F (37.7 C) Oral 86 19 97 %  03/02/17 0444 133/71 99.5 F (37.5 C) Oral (!) 102 18 94 %  03/01/17 2143 - 98.8 F (37.1 C) Oral - - -  03/01/17 1944 124/67 100.2 F (37.9 C) Oral (!) 112 18 96 %       Recent laboratory studies:  Recent Labs  03/01/17 0605 03/02/17 0345  WBC 6.0 6.7  HGB 10.5* 10.3*  HCT 32.6* 31.4*  PLT 175 171    Recent Labs  03/01/17 0605 03/02/17 0345  NA 136 134*  K 4.3 3.6  CL 106 104  CO2 27 25  BUN 12 10  CREATININE 0.81 0.74  GLUCOSE 114* 140*  CALCIUM 8.8* 8.7*   Lab Results  Component Value Date   INR 1.04 02/14/2017     Recent Radiographic Studies :  Dg Chest 2 View  Result Date: 02/14/2017 CLINICAL DATA:  Preoperative evaluation for total knee replacement EXAM: CHEST  2 VIEW COMPARISON:  Jan 23, 2014 FINDINGS: There is no edema or consolidation. Heart size and pulmonary vascularity are normal. No adenopathy. There is postoperative change in the lower cervical spine. There is degenerative change in the thoracic spine. IMPRESSION: No edema or consolidation. Electronically Signed   By: Bretta BangWilliam  Woodruff III M.D.   On: 02/14/2017 16:10    DISCHARGE INSTRUCTIONS: Discharge  Instructions    CPM    Complete by:  As directed    Continuous passive motion machine (CPM):      Use the CPM from 0 to 60 for 6-8 hours per day.      You may increase by 5-10 degrees per day.  You may break it up into 2 or 3 sessions per day.      Use CPM for 3-4  weeks or until you are told to stop.   Call MD / Call 911    Complete by:  As directed    If you experience chest pain or shortness of breath, CALL 911 and be transported to the hospital emergency room.  If you develope a fever above 101 F, pus (white drainage) or increased drainage or redness at the wound, or calf pain, call your surgeon's office.   Change dressing    Complete by:  As directed    DO NOT CHANGE YOUR DRESSING   Constipation Prevention    Complete by:  As directed    Drink plenty of fluids.  Prune juice may be helpful.  You may use a stool softener, such as Colace (over the counter) 100 mg twice a day.  Use MiraLax (over the counter) for constipation as needed.   Diet general    Complete by:  As directed    Discharge instructions    Complete by:  As directed    INSTRUCTIONS AFTER JOINT REPLACEMENT   Remove items at home which could result in a fall. This includes throw rugs or furniture in walking pathways ICE to the affected joint every three hours while awake for 30 minutes at a time, for at least the first 3-5 days, and then as needed for pain and swelling.  Continue to use ice for pain and swelling. You may notice swelling that will progress down to the foot and ankle.  This is normal after surgery.  Elevate your leg when you are not up walking on it.   Continue to use the breathing machine you got in the hospital (incentive spirometer) which will help keep your temperature down.  It is common for your temperature to cycle up and down following surgery, especially at night when you are not up moving around and exerting yourself.  The breathing machine keeps your lungs expanded and your temperature  down.   DIET:  As you were doing prior to hospitalization, we recommend a well-balanced diet.  DRESSING / WOUND CARE /  SHOWERING  Keep the surgical dressing until follow up.  The dressing is water proof, so you can shower without any extra covering.  IF THE DRESSING FALLS OFF or the wound gets wet inside, change the dressing with sterile gauze.  Please use good hand washing techniques before changing the dressing.  Do not use any lotions or creams on the incision until instructed by your surgeon.    ACTIVITY  Increase activity slowly as tolerated, but follow the weight bearing instructions below.   No driving for 6 weeks or until further direction given by your physician.  You cannot drive while taking narcotics.  No lifting or carrying greater than 10 lbs. until further directed by your surgeon. Avoid periods of inactivity such as sitting longer than an hour when not asleep. This helps prevent blood clots.  You may return to work once you are authorized by your doctor.     WEIGHT BEARING   Partial weight bearing with assist device as directed.  50%   EXERCISES  Results after joint replacement surgery are often greatly improved when you follow the exercise, range of motion and muscle strengthening exercises prescribed by your doctor. Safety measures are also important to protect the joint from further injury. Any time any of these exercises cause you to have increased pain or swelling, decrease what you are doing until you are comfortable again and then slowly increase them. If you have problems or questions, call your caregiver or physical therapist for advice.   Rehabilitation is important following a joint replacement. After just a few days of immobilization, the muscles of the leg can become weakened and shrink (atrophy).  These exercises are designed to build up the tone and strength of the thigh and leg muscles and to improve motion. Often times heat used for twenty to thirty  minutes before working out will loosen up your tissues and help with improving the range of motion but do not use heat for the first two weeks following surgery (sometimes heat can increase post-operative swelling).   These exercises can be done on a training (exercise) mat, on the floor, on a table or on a bed. Use whatever works the best and is most comfortable for you.    Use music or television while you are exercising so that the exercises are a pleasant break in your day. This will make your life better with the exercises acting as a break in your routine that you can look forward to.   Perform all exercises about fifteen times, three times per day or as directed.  You should exercise both the operative leg and the other leg as well.   Exercises include:  Quad Sets - Tighten up the muscle on the front of the thigh (Quad) and hold for 5-10 seconds.   Straight Leg Raises - With your knee straight (if you were given a brace, keep it on), lift the leg to 60 degrees, hold for 3 seconds, and slowly lower the leg.  Perform this exercise against resistance later as your leg gets stronger.  Leg Slides: Lying on your back, slowly slide your foot toward your buttocks, bending your knee up off the floor (only go as far as is comfortable). Then slowly slide your foot back down until your leg is flat on the floor again.  Angel Wings: Lying on your back spread your legs to the side as far apart as you can without causing discomfort.  Hamstring Strength:  Lying on your back, push  your heel against the floor with your leg straight by tightening up the muscles of your buttocks.  Repeat, but this time bend your knee to a comfortable angle, and push your heel against the floor.  You may put a pillow under the heel to make it more comfortable if necessary.   A rehabilitation program following joint replacement surgery can speed recovery and prevent re-injury in the future due to weakened muscles. Contact your doctor or  a physical therapist for more information on knee rehabilitation.    CONSTIPATION  Constipation is defined medically as fewer than three stools per week and severe constipation as less than one stool per week.  Even if you have a regular bowel pattern at home, your normal regimen is likely to be disrupted due to multiple reasons following surgery.  Combination of anesthesia, postoperative narcotics, change in appetite and fluid intake all can affect your bowels.   YOU MUST use at least one of the following options; they are listed in order of increasing strength to get the job done.  They are all available over the counter, and you may need to use some, POSSIBLY even all of these options:    Drink plenty of fluids (prune juice may be helpful) and high fiber foods Colace 100 mg by mouth twice a day  Senokot for constipation as directed and as needed Dulcolax (bisacodyl), take with full glass of water  Miralax (polyethylene glycol) once or twice a day as needed.  If you have tried all these things and are unable to have a bowel movement in the first 3-4 days after surgery call either your surgeon or your primary doctor.    If you experience loose stools or diarrhea, hold the medications until you stool forms back up.  If your symptoms do not get better within 1 week or if they get worse, check with your doctor.  If you experience "the worst abdominal pain ever" or develop nausea or vomiting, please contact the office immediately for further recommendations for treatment.   ITCHING:  If you experience itching with your medications, try taking only a single pain pill, or even half a pain pill at a time.  You can also use Benadryl over the counter for itching or also to help with sleep.   TED HOSE STOCKINGS:  Use stockings on both legs until for at least 2 weeks or as directed by physician office. They may be removed at night for sleeping.  MEDICATIONS:  See your medication summary on the "After  Visit Summary" that nursing will review with you.  You may have some home medications which will be placed on hold until you complete the course of blood thinner medication.  It is important for you to complete the blood thinner medication as prescribed.  PRECAUTIONS:  If you experience chest pain or shortness of breath - call 911 immediately for transfer to the hospital emergency department.   If you develop a fever greater that 101 F, purulent drainage from wound, increased redness or drainage from wound, foul odor from the wound/dressing, or calf pain - CONTACT YOUR SURGEON.                                                   FOLLOW-UP APPOINTMENTS:  If you do not already have a post-op appointment, please call the office  for an appointment to be seen by your surgeon.  Guidelines for how soon to be seen are listed in your "After Visit Summary", but are typically between 1-4 weeks after surgery.  OTHER INSTRUCTIONS:   Knee Replacement:  Do not place pillow under knee, focus on keeping the knee straight while resting. CPM instructions: 0-90 degrees, 2 hours in the morning, 2 hours in the afternoon, and 2 hours in the evening. Place foam block, curve side up under heel at all times except when in CPM or when walking.  DO NOT modify, tear, cut, or change the foam block in any way.  MAKE SURE YOU:  Understand these instructions.  Get help right away if you are not doing well or get worse.    Thank you for letting us be a part of your medical care team.  It is a privilege we respect greatly.  We hope these instructions will help you stay on track for a fast and full recovery!   Do not put a pillow under the knee. Place it under the heel.    Complete by:  As directed    Driving restrictions    Complete by:  As directed    No driving for 6 weeks   Increase activity slowly as tolerated    Complete by:  As directed    Lifting restrictions    Complete by:  As directed    No lifting for 6 weeks    Partial weight bearing    Complete by:  As directed    % Body Weight:  50%   Laterality:  right   Extremity:  Lower   Patient may shower    Complete by:  As directed    You may shower over the brown dressing   TED hose    Complete by:  As directed    Use stockings (TED hose) for 2-3 weeks on right leg.  You may remove them at night for sleeping.      DISCHARGE MEDICATIONS:   Allergies as of 03/02/2017      Reactions   Penicillins Hives      Medication List    STOP taking these medications   GLUCOSAMINE CHONDROITIN JOINT PO   ibuprofen 200 MG tablet Commonly known as:  ADVIL,MOTRIN   Turmeric 500 MG Caps     TAKE these medications   albuterol 108 (90 Base) MCG/ACT inhaler Commonly known as:  PROVENTIL HFA;VENTOLIN HFA Inhale into the lungs.   levothyroxine 75 MCG tablet Commonly known as:  SYNTHROID, LEVOTHROID Take 75 mcg by mouth daily before breakfast.   methocarbamol 500 MG tablet Commonly known as:  ROBAXIN Take 1 tablet (500 mg total) by mouth every 6 (six) hours as needed for muscle spasms.   multivitamin with minerals tablet Take 1 tablet by mouth daily.   oxyCODONE 5 MG immediate release tablet Commonly known as:  Oxy IR/ROXICODONE Take 1-2 tablets (5-10 mg total) by mouth every 4 (four) hours as needed for breakthrough pain.   rivaroxaban 10 MG Tabs tablet Commonly known as:  XARELTO Take 1 tablet (10 mg total) by mouth daily with breakfast. Start taking on:  03/03/2017            Durable Medical Equipment        Start     Ordered   02/28/17 1400  DME Walker rolling  Once    Question:  Patient needs a walker to treat with the following condition  Answer:  S/P total  knee replacement using cement, left   02/28/17 1359   02/28/17 1400  DME 3 n 1  Once     02/28/17 1359   02/28/17 1400  DME Bedside commode  Once    Question:  Patient needs a bedside commode to treat with the following condition  Answer:  S/P total knee replacement using cement,  right   02/28/17 1359      FOLLOW UP VISIT:   Follow-up Information    [facility].   Specialty:  Home Health Services Why:  A representative from [facility] will contact you to arrange start date and time for therapy. Contact information: [address] [place] [address] [phone]        [name], MD Follow up on 03/13/2017.   Specialty:  Orthopedic Surgery Contact information: [address] [place] [address] [phone]           DISPOSITION:   Home  CONDITION:  Stable   Oris Drone. Aleda Grana [facility] [phone]  03/02/2017 3:49 PM

## 2017-03-06 ENCOUNTER — Telehealth (INDEPENDENT_AMBULATORY_CARE_PROVIDER_SITE_OTHER): Payer: Self-pay | Admitting: Orthopaedic Surgery

## 2017-03-06 NOTE — Telephone Encounter (Signed)
[facility] called needing a verbal authorization for the patient's PT.  3x a week for 2 weeks

## 2017-03-06 NOTE — Telephone Encounter (Signed)
OK with PW, called

## 2017-03-09 ENCOUNTER — Telehealth (INDEPENDENT_AMBULATORY_CARE_PROVIDER_SITE_OTHER): Payer: Self-pay | Admitting: Orthopaedic Surgery

## 2017-03-09 NOTE — Telephone Encounter (Signed)
Please advise on meds.

## 2017-03-09 NOTE — Telephone Encounter (Signed)
Patient called requesting refills on her Methocarbamol, Xarelto, and oxycodone.  She uses [facility] on [address]  CB#[phone].  Thank you.

## 2017-03-09 NOTE — Telephone Encounter (Signed)
Hold xarelto,renew robaxin and oxycodone

## 2017-03-10 ENCOUNTER — Telehealth (INDEPENDENT_AMBULATORY_CARE_PROVIDER_SITE_OTHER): Payer: Self-pay | Admitting: Orthopaedic Surgery

## 2017-03-10 ENCOUNTER — Other Ambulatory Visit (INDEPENDENT_AMBULATORY_CARE_PROVIDER_SITE_OTHER): Payer: Self-pay

## 2017-03-10 ENCOUNTER — Other Ambulatory Visit (INDEPENDENT_AMBULATORY_CARE_PROVIDER_SITE_OTHER): Payer: Self-pay | Admitting: Family

## 2017-03-10 DIAGNOSIS — Z96651 Presence of right artificial knee joint: Secondary | ICD-10-CM

## 2017-03-10 MED ORDER — OXYCODONE HCL 5 MG PO TABS: 5.0000 mg | ORAL_TABLET | Freq: Three times a day (TID) | ORAL | 0 refills | Status: DC | PRN

## 2017-03-10 MED ORDER — METHOCARBAMOL 500 MG PO TABS: 500.0000 mg | ORAL_TABLET | Freq: Three times a day (TID) | ORAL | 0 refills | Status: DC | PRN

## 2017-03-10 NOTE — Telephone Encounter (Signed)
Sent referral 

## 2017-03-10 NOTE — Telephone Encounter (Signed)
[name] a physical Environmental health practitionertherapist assistant [facility] stating that they are going to discharge the patient at the end of next week.  Patient would like to start OT the following week [facility] on [address].  She is needing Dr. Cleophas DunkerWhitfield to place an order and have it faxed to them.  CB#[phone].  Thank you.

## 2017-03-13 ENCOUNTER — Ambulatory Visit (INDEPENDENT_AMBULATORY_CARE_PROVIDER_SITE_OTHER): Payer: Medicare Other

## 2017-03-13 ENCOUNTER — Encounter (INDEPENDENT_AMBULATORY_CARE_PROVIDER_SITE_OTHER): Payer: Self-pay | Admitting: Orthopaedic Surgery

## 2017-03-13 ENCOUNTER — Ambulatory Visit (INDEPENDENT_AMBULATORY_CARE_PROVIDER_SITE_OTHER): Payer: Medicare Other | Admitting: Orthopaedic Surgery

## 2017-03-13 ENCOUNTER — Other Ambulatory Visit (INDEPENDENT_AMBULATORY_CARE_PROVIDER_SITE_OTHER): Payer: Self-pay

## 2017-03-13 VITALS — BP 120/80 | HR 70 | Ht 65.0 in | Wt 178.0 lb

## 2017-03-13 DIAGNOSIS — Z96651 Presence of right artificial knee joint: Secondary | ICD-10-CM | POA: Diagnosis not present

## 2017-03-13 NOTE — Addendum Note (Signed)
Addended by: Mare LoanLAWSON, Natalynn Pedone M on: 03/13/2017 05:26 PM   Modules accepted: Orders

## 2017-03-13 NOTE — Progress Notes (Signed)
   Post-Op Visit Note   Patient: [name]           Date of Birth: May 19, 1941           MRN: [id number] Visit Date: 03/13/2017 PCP: Kristen HeysEhinger, Robert, MD   Assessment & Plan:  Chief Complaint:  Chief Complaint  Patient presents with  . Right Knee - Routine Post Op    Ms. [name] is status post 2 weeks R TKA. Staples removed, steri strips applied. Ambulates with a walker   Visit Diagnoses:  1. Presence of right artificial knee joint   Right knee wound looks clean and dry. Staples removed and Steri-Strips applied. No calf pain. No significant swelling. Neurovascular exam intact films reveal excellent position of the components  Plan: Office 2 weeks. Weightbearing as tolerated. Limited use of oxycodone and supplemented with Advil. Stop xarelto  Follow-Up Instructions: Return in about 2 weeks (around 03/27/2017).   Orders:  Orders Placed This Encounter  Procedures  . XR KNEE 3 VIEW RIGHT   No orders of the defined types were placed in this encounter.   Imaging: No results found.  PMFS History: Patient Active Problem List   Diagnosis Date Noted  . S/P TKR (total knee replacement) using cement, right 02/28/2017  . Unilateral primary osteoarthritis, left knee 01/05/2017  . Unilateral primary osteoarthritis, right knee 01/05/2017  . Atypical chest pain 05/22/2012   Past Medical History:  Diagnosis Date  . Acid reflux disease   . Arthritis   . Atypical chest pain   . Back pain   . Depression   . Gastroparesis   . H/O: hysterectomy   . Hiatal hernia   . Hypothyroidism   . Neck pain   . Self inflicted injury   . Sleep apnea     Family History  Problem Relation Age of Onset  . Alzheimer's disease Mother   . Renal Disease Father     Past Surgical History:  Procedure Laterality Date  . ABDOMINAL HYSTERECTOMY    . COLONOSCOPY Leonard/ BIOPSIES AND POLYPECTOMY    . NECK SURGERY     Metal plate in neck for degenerative disc disease  . TOTAL KNEE ARTHROPLASTY Right  02/28/2017   Procedure: RIGHT TOTAL KNEE ARTHROPLASTY;  Surgeon: Kristen BatmanWhitfield, Shyteria Lewis W, MD;  [facility];  Service: Orthopedics;  Laterality: Right;   Social History   Occupational History  . Not on file.   Social History Main Topics  . Smoking status: Never Smoker  . Smokeless tobacco: Never Used  . Alcohol use No  . Drug use: No  . Sexual activity: Not on file

## 2017-03-23 ENCOUNTER — Ambulatory Visit (INDEPENDENT_AMBULATORY_CARE_PROVIDER_SITE_OTHER): Payer: Medicare Other | Admitting: Orthopedic Surgery

## 2017-03-24 ENCOUNTER — Telehealth (INDEPENDENT_AMBULATORY_CARE_PROVIDER_SITE_OTHER): Payer: Self-pay | Admitting: Orthopaedic Surgery

## 2017-03-24 NOTE — Telephone Encounter (Signed)
PT Initial Examination faxed 03/24/17

## 2017-03-27 ENCOUNTER — Encounter (INDEPENDENT_AMBULATORY_CARE_PROVIDER_SITE_OTHER): Payer: Self-pay | Admitting: Orthopaedic Surgery

## 2017-03-27 ENCOUNTER — Ambulatory Visit (INDEPENDENT_AMBULATORY_CARE_PROVIDER_SITE_OTHER): Payer: Medicare Other | Admitting: Orthopaedic Surgery

## 2017-03-27 VITALS — BP 137/81 | HR 82 | Resp 14 | Ht 64.0 in | Wt 165.0 lb

## 2017-03-27 DIAGNOSIS — Z96651 Presence of right artificial knee joint: Secondary | ICD-10-CM

## 2017-03-27 NOTE — Progress Notes (Signed)
   Post-Op Visit Note   Patient: Kristen MooresBarbara E Leonard           Date of Birth: 07-28-1941           MRN: [id number] Visit Date: 03/27/2017 PCP: Blair HeysEhinger, Robert, MD   Assessment & Plan:  Chief Complaint:  Chief Complaint  Patient presents with  . Left Knee - Routine Post Op, Edema    [name] is a 76 y o that is 4 weeks status post Right TKA. She ambulates with a walker and is doing well with PT.    Visit Diagnoses:  1. History of total right knee replacement   1 month status post primary right total knee replacement doing well. Very minimal pain. No fever or chills.  Plan: Right knee with full extension about 105 flexion. No instability. Incision healed nicely. No swelling distally. Neurovascular exam intact. Would like her to progress to weightbearing as tolerated with a cane. Work on exercises to strengthen her quads and hamstrings. Return in 1 month  Follow-Up Instructions: Return in about 1 month (around 04/27/2017).   Orders:  No orders of the defined types were placed in this encounter.  No orders of the defined types were placed in this encounter.   Imaging: No results found.  PMFS History: Patient Active Problem List   Diagnosis Date Noted  . S/P TKR (total knee replacement) using cement, right 02/28/2017  . Unilateral primary osteoarthritis, left knee 01/05/2017  . Unilateral primary osteoarthritis, right knee 01/05/2017  . Atypical chest pain 05/22/2012   Past Medical History:  Diagnosis Date  . Acid reflux disease   . Arthritis   . Atypical chest pain   . Back pain   . Depression   . Gastroparesis   . H/O: hysterectomy   . Hiatal hernia   . Hypothyroidism   . Neck pain   . Self inflicted injury   . Sleep apnea     Family History  Problem Relation Age of Onset  . Alzheimer's disease Mother   . Renal Disease Father     Past Surgical History:  Procedure Laterality Date  . ABDOMINAL HYSTERECTOMY    . COLONOSCOPY W/ BIOPSIES AND POLYPECTOMY    . NECK  SURGERY     Metal plate in neck for degenerative disc disease  . TOTAL KNEE ARTHROPLASTY Right 02/28/2017   Procedure: RIGHT TOTAL KNEE ARTHROPLASTY;  Surgeon: Valeria BatmanWhitfield, Briel Gallicchio W, MD;  [facility];  Service: Orthopedics;  Laterality: Right;   Social History   Occupational History  . Not on file.   Social History Main Topics  . Smoking status: Never Smoker  . Smokeless tobacco: Never Used  . Alcohol use No  . Drug use: No  . Sexual activity: Not on file

## 2017-03-30 ENCOUNTER — Telehealth (INDEPENDENT_AMBULATORY_CARE_PROVIDER_SITE_OTHER): Payer: Self-pay | Admitting: Orthopaedic Surgery

## 2017-03-30 NOTE — Telephone Encounter (Signed)
Patient would like to know if she can drive before her appt on 5/17/618/31/18. Patient had a total knee done and states she will be 6 weeks post op in mid August.

## 2017-03-30 NOTE — Telephone Encounter (Signed)
Called pt and told her she can drive as tolerated mid August per PW

## 2017-04-28 ENCOUNTER — Ambulatory Visit (INDEPENDENT_AMBULATORY_CARE_PROVIDER_SITE_OTHER): Payer: Medicare Other | Admitting: Orthopaedic Surgery

## 2017-05-04 ENCOUNTER — Ambulatory Visit (INDEPENDENT_AMBULATORY_CARE_PROVIDER_SITE_OTHER): Payer: Medicare Other | Admitting: Orthopedic Surgery

## 2017-05-04 ENCOUNTER — Encounter (INDEPENDENT_AMBULATORY_CARE_PROVIDER_SITE_OTHER): Payer: Self-pay | Admitting: Orthopedic Surgery

## 2017-05-04 VITALS — BP 150/85 | HR 78 | Resp 14 | Ht 64.0 in | Wt 165.0 lb

## 2017-05-04 DIAGNOSIS — Z96651 Presence of right artificial knee joint: Secondary | ICD-10-CM

## 2017-05-04 NOTE — Progress Notes (Signed)
Office Visit Note   Patient: Kristen MooresBarbara E Leonard           Date of Birth: 07-02-1941           MRN: [id number] Visit Date: 05/04/2017              Requested by: Blair HeysEhinger, Robert, MD [address] [place], [place] [address] PCP: Blair HeysEhinger, Robert, MD   Assessment & Plan: Visit Diagnoses:  1. S/P total knee replacement using cement, right     Plan:  #1: Continue physical therapy #2: Instructed in straight leg Raising exercises  Follow-Up Instructions: Return in about 3 weeks (around 05/25/2017).   Orders:  No orders of the defined types were placed in this encounter.  No orders of the defined types were placed in this encounter.     Procedures: No procedures performed   Clinical Data: No additional findings.   Subjective: Chief Complaint  Patient presents with  . Right Knee - Routine Post Op    [name] is a 76 y o that is 2 months status post Right TKA. She relates very little pain and minimal swelling    HPI  [name] is now 2 months status post right total knee arthroplasty. She's doing well. Still has some swelling according to her. Pain is improving. She states this certainly is better than it was preoperatively in regards to her pain. So far she's very happy with the results. Her questioning now is 1 to do the left knee since it is now becoming painful. Denies any calf pain. Working hard in physical therapy.  Review of Systems  Constitutional: Negative for chills, fatigue and fever.  Eyes: Negative for itching.  Respiratory: Negative for chest tightness and shortness of breath.   Cardiovascular: Negative for chest pain, palpitations and leg swelling.  Gastrointestinal: Negative for blood in stool, constipation and diarrhea.  Musculoskeletal: Negative for back pain, joint swelling, neck pain and neck stiffness.  Neurological: Negative for dizziness, weakness, numbness and headaches.  Hematological: Does not bruise/bleed easily.  Psychiatric/Behavioral:  Negative for sleep disturbance. The patient is not nervous/anxious.      Objective: Vital Signs: BP (!) 150/85   Pulse 78   Resp 14   Ht 5\' 4"  (1.626 m)   Wt 165 lb (74.8 kg)   BMI 28.32 kg/m   Physical Exam  Ortho Exam  Today she has a well-healed surgical incision. Good ligamentous stability. She does have a trace effusion. No warmth or erythema. Range of motion still lacks a few degrees of full extension but she flexes to 115. Calf supple nontender. No rest intact distally.  Specialty Comments:  No specialty comments available.  Imaging: No results found.   PMFS History: Patient Active Problem List   Diagnosis Date Noted  . S/P TKR (total knee replacement) using cement, right 02/28/2017  . Unilateral primary osteoarthritis, left knee 01/05/2017  . Unilateral primary osteoarthritis, right knee 01/05/2017  . Atypical chest pain 05/22/2012   Past Medical History:  Diagnosis Date  . Acid reflux disease   . Arthritis   . Atypical chest pain   . Back pain   . Depression   . Gastroparesis   . H/O: hysterectomy   . Hiatal hernia   . Hypothyroidism   . Neck pain   . Self inflicted injury   . Sleep apnea     Family History  Problem Relation Age of Onset  . Alzheimer's disease Mother   . Renal Disease Father  Past Surgical History:  Procedure Laterality Date  . ABDOMINAL HYSTERECTOMY    . COLONOSCOPY W/ BIOPSIES AND POLYPECTOMY    . NECK SURGERY     Metal plate in neck for degenerative disc disease  . TOTAL KNEE ARTHROPLASTY Right 02/28/2017   Procedure: RIGHT TOTAL KNEE ARTHROPLASTY;  Surgeon: [name], MD;  Location: [facility] OR;  Service: Orthopedics;  Laterality: Right;   Social History   Occupational History  . Not on file.   Social History Main Topics  . Smoking status: Never Smoker  . Smokeless tobacco: Never Used  . Alcohol use No  . Drug use: No  . Sexual activity: Not on file

## 2017-05-29 ENCOUNTER — Encounter (INDEPENDENT_AMBULATORY_CARE_PROVIDER_SITE_OTHER): Payer: Self-pay | Admitting: Orthopaedic Surgery

## 2017-05-29 ENCOUNTER — Ambulatory Visit (INDEPENDENT_AMBULATORY_CARE_PROVIDER_SITE_OTHER): Payer: Medicare Other | Admitting: Orthopaedic Surgery

## 2017-05-29 VITALS — BP 129/75 | HR 89 | Resp 12 | Ht 64.0 in | Wt 175.0 lb

## 2017-05-29 DIAGNOSIS — Z96651 Presence of right artificial knee joint: Secondary | ICD-10-CM

## 2017-05-29 NOTE — Progress Notes (Signed)
Office Visit Note   Patient: [name]           Date of Birth: 1940-12-10           MRN: [id number] Visit Date: 05/29/2017              Requested by: Blair Heys, MD [address] [place], [place] [address] PCP: Blair Heys, MD   Assessment & Plan: Visit Diagnoses:  1. History of total right knee replacement     Plan: progressing nicely 3 months status post right total knee replacement. Does use a single-point cane. Wishes to proceed with a left total knee replacement sometime in the next 6 weeks. We'll schedule.  Follow-Up Instructions: Return will schedule left TKR.   Orders:  No orders of the defined types were placed in this encounter.  No orders of the defined types were placed in this encounter.     Procedures: No procedures performed   Clinical Data: No additional findings.   Subjective: Chief Complaint  Patient presents with  . Right Knee - Routine Post Op    [name] is a 76 y o S/P 3 months R TKA. She relates she wants to discuss L TKA around Thanksgiving. Ambulates with a cane. Pt taking advil 1 x daily.  no related fever or chills. Comfortable with single-point cane. Minimal discomfort right knee.  HPI  Review of Systems  Constitutional: Positive for fatigue. Negative for chills and fever.  Eyes: Negative for itching.  Respiratory: Negative for chest tightness and shortness of breath.   Cardiovascular: Negative for chest pain, palpitations and leg swelling.  Gastrointestinal: Negative for blood in stool, constipation and diarrhea.  Endocrine: Negative for polyuria.  Genitourinary: Negative for dysuria.  Musculoskeletal: Positive for neck pain. Negative for back pain, joint swelling and neck stiffness.  Allergic/Immunologic: Negative for immunocompromised state.  Neurological: Positive for light-headedness. Negative for dizziness and numbness.  Hematological: Does not bruise/bleed easily.  Psychiatric/Behavioral: The  patient is not nervous/anxious.      Objective: Vital Signs: BP 129/75   Pulse 89   Resp 12   Ht  (1.626 m)   Wt 175 lb (79.4 kg)   BMI 30.04 kg/m   Physical Exam  Ortho Examawake alert and oriented 3. Comfortable sitting. Little bit of a limp on first arising. No right knee effusion. No instability. Knee was not hot warm or red. No calf pain. Neurovascular exam intact. Flexes 115 with full extension  Specialty Comments:  No specialty comments available.  Imaging: No results found.   PMFS History: Patient Active Problem List   Diagnosis Date Noted  . S/P TKR (total knee replacement) using cement, right 02/28/2017  . Unilateral primary osteoarthritis, left knee 01/05/2017  . Unilateral primary osteoarthritis, right knee 01/05/2017  . Atypical chest pain 05/22/2012   Past Medical History:  Diagnosis Date  . Acid reflux disease   . Arthritis   . Atypical chest pain   . Back pain   . Depression   . Gastroparesis   . H/O: hysterectomy   . Hiatal hernia   . Hypothyroidism   . Neck pain   . Self inflicted injury   . Sleep apnea     Family History  Problem Relation Age of Onset  . Alzheimer's disease Mother   . Renal Disease Father     Past Surgical History:  Procedure Laterality Date  . ABDOMINAL HYSTERECTOMY    . COLONOSCOPY W/ BIOPSIES AND POLYPECTOMY    .  NECK SURGERY     Metal plate in neck for degenerative disc disease  . TOTAL KNEE ARTHROPLASTY Right 02/28/2017   Procedure: RIGHT TOTAL KNEE ARTHROPLASTY;  Surgeon: [name], MD;  Location: [facility] OR;  Service: Orthopedics;  Laterality: Right;   Social History   Occupational History  . Not on file.   Social History Main Topics  . Smoking status: Never Smoker  . Smokeless tobacco: Never Used  . Alcohol use No  . Drug use: No  . Sexual activity: Not on file

## 2017-06-08 ENCOUNTER — Telehealth (INDEPENDENT_AMBULATORY_CARE_PROVIDER_SITE_OTHER): Payer: Self-pay | Admitting: Orthopedic Surgery

## 2017-06-08 NOTE — Telephone Encounter (Signed)
I called [name] to discuss scheduling her left total knee arthroplasty.  Left message for her to return my call.

## 2017-06-29 ENCOUNTER — Other Ambulatory Visit (INDEPENDENT_AMBULATORY_CARE_PROVIDER_SITE_OTHER): Payer: Self-pay

## 2017-07-05 ENCOUNTER — Ambulatory Visit (INDEPENDENT_AMBULATORY_CARE_PROVIDER_SITE_OTHER): Payer: Medicare Other | Admitting: Orthopedic Surgery

## 2017-07-05 ENCOUNTER — Encounter (INDEPENDENT_AMBULATORY_CARE_PROVIDER_SITE_OTHER): Payer: Self-pay | Admitting: Orthopedic Surgery

## 2017-07-05 VITALS — BP 130/77 | HR 78 | Temp 98.0°F | Ht 65.0 in | Wt 175.0 lb

## 2017-07-05 DIAGNOSIS — M1712 Unilateral primary osteoarthritis, left knee: Secondary | ICD-10-CM | POA: Diagnosis not present

## 2017-07-05 NOTE — H&P (Signed)
TOTAL KNEE ADMISSION H&P  Patient is being admitted for left total knee arthroplasty.  Subjective:  Chief Complaint:left knee pain.  HPI: [name], 76 y.o. female, has a history of pain and functional disability in the left knee due to arthritis and has failed non-surgical conservative treatments for greater than 12 weeks to includeNSAID's and/or analgesics, corticosteriod injections, viscosupplementation injections, flexibility and strengthening excercises, supervised PT with diminished ADL's post treatment, weight reduction as appropriate and activity modification.  Onset of symptoms was abrupt, starting 2 years ago with rapidlly worsening course since that time. The patient noted no past surgery on the left knee(s).  Patient currently rates pain in the left knee(s) at 8 out of 10 with activity. Patient has night pain, worsening of pain with activity and weight bearing, pain that interferes with activities of daily living, pain with passive range of motion, crepitus and joint swelling.  Patient has evidence of subchondral cysts, subchondral sclerosis, periarticular osteophytes and joint space narrowing by imaging studies. This patient has had proximal tibial fracture and no surgeries. There is no active infection.    Patient Active Problem List   Diagnosis Date Noted  . S/P TKR (total knee replacement) using cement, right 02/28/2017  . Unilateral primary osteoarthritis, left knee 01/05/2017  . Unilateral primary osteoarthritis, right knee 01/05/2017  . Atypical chest pain 05/22/2012   Past Medical History:  Diagnosis Date  . Acid reflux disease   . Arthritis   . Atypical chest pain   . Back pain   . Depression   . Gastroparesis   . H/O: hysterectomy   . Hiatal hernia   . Hypothyroidism   . Neck pain   . Self inflicted injury   . Sleep apnea     Past Surgical History:  Procedure Laterality Date  . ABDOMINAL HYSTERECTOMY    . COLONOSCOPY W/ BIOPSIES AND POLYPECTOMY    .  NECK SURGERY     Metal plate in neck for degenerative disc disease    No current facility-administered medications for this encounter.    Current Outpatient Medications  Medication Sig Dispense Refill Last Dose  . albuterol (PROVENTIL HFA;VENTOLIN HFA) 108 (90 Base) MCG/ACT inhaler Inhale 2 puffs every 6 (six) hours as needed into the lungs for wheezing or shortness of breath.    Taking  . glucosamine-chondroitin 500-400 MG tablet Take 1 tablet daily by mouth.   Taking  . ibuprofen (ADVIL,MOTRIN) 200 MG tablet Take 200 mg at bedtime by mouth.    Taking  . levothyroxine (SYNTHROID, LEVOTHROID) 75 MCG tablet Take 75 mcg by mouth daily before breakfast.    Taking  . Menthol, Topical Analgesic, (BIOFREEZE EX) Apply 1 application daily topically.   Taking  . Multiple Vitamins-Minerals (MULTIVITAMIN WITH MINERALS) tablet Take 1 tablet by mouth daily.   Taking  . methocarbamol (ROBAXIN) 500 MG tablet Take 1 tablet (500 mg total) by mouth every 8 (eight) hours as needed for muscle spasms. (Patient not taking: Reported on 03/27/2017) 30 tablet 0 Not Taking  . oxyCODONE (OXY IR/ROXICODONE) 5 MG immediate release tablet Take 1 tablet (5 mg total) by mouth every 8 (eight) hours as needed for severe pain. (Patient not taking: Reported on 03/27/2017) 30 tablet 0 Not Taking  . rivaroxaban (XARELTO) 10 MG TABS tablet Take 1 tablet (10 mg total) by mouth daily with breakfast. (Patient not taking: Reported on 03/27/2017) 12 tablet 0 Not Taking   Allergies  Allergen Reactions  . Penicillins Hives and Other (See Comments)      Has patient had a PCN reaction causing immediate rash, facial/tongue/throat swelling, SOB or lightheadedness with hypotension: Unknown Has patient had a PCN reaction causing severe rash involving mucus membranes or skin necrosis: Unknown Has patient had a PCN reaction that required hospitalization: No Has patient had a PCN reaction occurring within the last 10 years: No If all of the above  answers are "NO", then may proceed with Cephalosporin use.  Other reaction(s): Unknown    Social History   Tobacco Use  . Smoking status: Never Smoker  . Smokeless tobacco: Never Used  Substance Use Topics  . Alcohol use: No    Family History  Problem Relation Age of Onset  . Alzheimer's disease Mother   . Renal Disease Father      Review of Systems  Endo/Heme/Allergies:       History of hypothyroid  All other systems reviewed and are negative.   Objective:  Physical Exam  Constitutional: She is oriented to person, place, and time. She appears well-developed and well-nourished.  HENT:  Head: Normocephalic and atraumatic.  Eyes: Conjunctivae and EOM are normal. Pupils are equal, round, and reactive to light. Right eye exhibits no discharge. Left eye exhibits no discharge.  Neck: Neck supple. No thyromegaly present.  Cardiovascular: Normal rate, regular rhythm, normal heart sounds and intact distal pulses.  No murmur heard. Respiratory: Effort normal and breath sounds normal. She has no wheezes. She has no rales.  GI: Soft. Bowel sounds are normal. There is no tenderness.  Neurological: She is alert and oriented to person, place, and time. No cranial nerve deficit. Coordination normal.  Skin: Skin is warm and dry.  Psychiatric: She has a normal mood and affect. Her behavior is normal. Judgment and thought content normal.    Vital signs in last 24 hours: Temp:  [98 F (36.7 C)] 98 F (36.7 C) (11/07 1031) Pulse Rate:  [78] 78 (11/07 1031) BP: (130)/(77) 130/77 (11/07 1031) Weight:  [175 lb (79.4 kg)] 175 lb (79.4 kg) (11/07 1031)  Labs:   Estimated body mass index is 29.12 kg/m as calculated from the following:   Height as of 07/05/17: 5\' 5"  (1.651 m).   Weight as of 07/05/17: 175 lb (79.4 kg).   Imaging Review Plain radiographs demonstrate moderate degenerative joint disease of the left knee(s). The overall alignment ismild varus. The bone quality appears to be  good for age and reported activity level.  Assessment/Plan:  End stage arthritis, left knee   The patient history, physical examination, clinical judgment of the provider and imaging studies are consistent with end stage degenerative joint disease of the left knee(s) and total knee arthroplasty is deemed medically necessary. The treatment options including medical management, injection therapy arthroscopy and arthroplasty were discussed at length. The risks and benefits of total knee arthroplasty were presented and reviewed. The risks due to aseptic loosening, infection, stiffness, patella tracking problems, thromboembolic complications and other imponderables were discussed. The patient acknowledged the explanation, agreed to proceed with the plan and consent was signed. Patient is being admitted for inpatient treatment for surgery, pain control, PT, OT, prophylactic antibiotics, VTE prophylaxis, progressive ambulation and ADL's and discharge planning. The patient is planning to be discharged home with home health services   Oris DroneBrian D. Aleda Granaetrarca, PA-C [facility] [phone]  07/05/2017 12:56 PM

## 2017-07-05 NOTE — Progress Notes (Deleted)
   Office Visit Note   Patient: Marcello MooresBarbara E Wigington           Date of Birth: 08/31/1940           MRN: [id number] Visit Date: 07/05/2017              Requested by: Blair HeysEhinger, Robert, MD [address] [place], [place] [address] PCP: Blair HeysEhinger, Robert, MD   Assessment & Plan: Visit Diagnoses: No diagnosis found.  Plan: ***  Follow-Up Instructions: No Follow-up on file.   Orders:  No orders of the defined types were placed in this encounter.  No orders of the defined types were placed in this encounter.     Procedures: No procedures performed   Clinical Data: No additional findings.   Subjective: No chief complaint on file.   HPI  Review of Systems  HENT: Negative.   Eyes: Negative.   Respiratory: Positive for shortness of breath.   Cardiovascular: Negative.   Gastrointestinal: Positive for constipation.  Musculoskeletal: Positive for joint swelling, myalgias, neck pain and neck stiffness.  Skin: Negative.   Neurological: Negative.   Hematological: Bruises/bleeds easily.  Psychiatric/Behavioral: Positive for sleep disturbance.     Objective: Vital Signs: Ht 5\' 5"  (1.651 m)   Wt 175 lb (79.4 kg)   BMI 29.12 kg/m   Physical Exam  Ortho Exam  Specialty Comments:  No specialty comments available.  Imaging: No results found.   PMFS History: Patient Active Problem List   Diagnosis Date Noted  . S/P TKR (total knee replacement) using cement, right 02/28/2017  . Unilateral primary osteoarthritis, left knee 01/05/2017  . Unilateral primary osteoarthritis, right knee 01/05/2017  . Atypical chest pain 05/22/2012   Past Medical History:  Diagnosis Date  . Acid reflux disease   . Arthritis   . Atypical chest pain   . Back pain   . Depression   . Gastroparesis   . H/O: hysterectomy   . Hiatal hernia   . Hypothyroidism   . Neck pain   . Self inflicted injury   . Sleep apnea     Family History  Problem Relation Age of Onset  . Alzheimer's  disease Mother   . Renal Disease Father     Past Surgical History:  Procedure Laterality Date  . ABDOMINAL HYSTERECTOMY    . COLONOSCOPY W/ BIOPSIES AND POLYPECTOMY    . NECK SURGERY     Metal plate in neck for degenerative disc disease   Social History   Occupational History  . Not on file  Tobacco Use  . Smoking status: Never Smoker  . Smokeless tobacco: Never Used  Substance and Sexual Activity  . Alcohol use: No  . Drug use: No  . Sexual activity: Not on file

## 2017-07-05 NOTE — Progress Notes (Signed)
TOTAL KNEE ADMISSION H&P  Patient is being admitted for left total knee arthroplasty.  Subjective:  Chief Complaint:left knee pain.  HPI: [name], 76 y.o. female, has a history of pain and functional disability in the left knee due to arthritis and has failed non-surgical conservative treatments for greater than 12 weeks to includeNSAID's and/or analgesics, corticosteriod injections, viscosupplementation injections, flexibility and strengthening excercises, supervised PT with diminished ADL's post treatment, weight reduction as appropriate and activity modification.  Onset of symptoms was abrupt, starting 2 years ago with rapidlly worsening course since that time. The patient noted no past surgery on the left knee(s).  Patient currently rates pain in the left knee(s) at 8 out of 10 with activity. Patient has night pain, worsening of pain with activity and weight bearing, pain that interferes with activities of daily living, pain with passive range of motion, crepitus and joint swelling.  Patient has evidence of subchondral cysts, subchondral sclerosis, periarticular osteophytes and joint space narrowing by imaging studies. This patient has had proximal tibial fracture and no surgeries. There is no active infection.    Patient Active Problem List   Diagnosis Date Noted  . S/P TKR (total knee replacement) using cement, right 02/28/2017  . Unilateral primary osteoarthritis, left knee 01/05/2017  . Unilateral primary osteoarthritis, right knee 01/05/2017  . Atypical chest pain 05/22/2012   Past Medical History:  Diagnosis Date  . Acid reflux disease   . Arthritis   . Atypical chest pain   . Back pain   . Depression   . Gastroparesis   . H/O: hysterectomy   . Hiatal hernia   . Hypothyroidism   . Neck pain   . Self inflicted injury   . Sleep apnea     Past Surgical History:  Procedure Laterality Date  . ABDOMINAL HYSTERECTOMY    . COLONOSCOPY W/ BIOPSIES AND POLYPECTOMY    .  NECK SURGERY     Metal plate in neck for degenerative disc disease    No current facility-administered medications for this encounter.    Current Outpatient Medications  Medication Sig Dispense Refill Last Dose  . albuterol (PROVENTIL HFA;VENTOLIN HFA) 108 (90 Base) MCG/ACT inhaler Inhale 2 puffs every 6 (six) hours as needed into the lungs for wheezing or shortness of breath.    Taking  . glucosamine-chondroitin 500-400 MG tablet Take 1 tablet daily by mouth.   Taking  . ibuprofen (ADVIL,MOTRIN) 200 MG tablet Take 200 mg at bedtime by mouth.    Taking  . levothyroxine (SYNTHROID, LEVOTHROID) 75 MCG tablet Take 75 mcg by mouth daily before breakfast.    Taking  . Menthol, Topical Analgesic, (BIOFREEZE EX) Apply 1 application daily topically.   Taking  . Multiple Vitamins-Minerals (MULTIVITAMIN WITH MINERALS) tablet Take 1 tablet by mouth daily.   Taking  . methocarbamol (ROBAXIN) 500 MG tablet Take 1 tablet (500 mg total) by mouth every 8 (eight) hours as needed for muscle spasms. (Patient not taking: Reported on 03/27/2017) 30 tablet 0 Not Taking  . oxyCODONE (OXY IR/ROXICODONE) 5 MG immediate release tablet Take 1 tablet (5 mg total) by mouth every 8 (eight) hours as needed for severe pain. (Patient not taking: Reported on 03/27/2017) 30 tablet 0 Not Taking  . rivaroxaban (XARELTO) 10 MG TABS tablet Take 1 tablet (10 mg total) by mouth daily with breakfast. (Patient not taking: Reported on 03/27/2017) 12 tablet 0 Not Taking   Allergies  Allergen Reactions  . Penicillins Hives and Other (See Comments)  Has patient had a PCN reaction causing immediate rash, facial/tongue/throat swelling, SOB or lightheadedness with hypotension: Unknown Has patient had a PCN reaction causing severe rash involving mucus membranes or skin necrosis: Unknown Has patient had a PCN reaction that required hospitalization: No Has patient had a PCN reaction occurring within the last 10 years: No If all of the above  answers are "NO", then may proceed with Cephalosporin use.  Other reaction(s): Unknown    Social History   Tobacco Use  . Smoking status: Never Smoker  . Smokeless tobacco: Never Used  Substance Use Topics  . Alcohol use: No    Family History  Problem Relation Age of Onset  . Alzheimer's disease Mother   . Renal Disease Father      Review of Systems  Endo/Heme/Allergies:       History of hypothyroid  All other systems reviewed and are negative.   Objective:  Physical Exam  Constitutional: She is oriented to person, place, and time. She appears well-developed and well-nourished.  HENT:  Head: Normocephalic and atraumatic.  Eyes: Conjunctivae and EOM are normal. Pupils are equal, round, and reactive to light. Right eye exhibits no discharge. Left eye exhibits no discharge.  Neck: Neck supple. No thyromegaly present.  Cardiovascular: Normal rate, regular rhythm, normal heart sounds and intact distal pulses.  No murmur heard. Respiratory: Effort normal and breath sounds normal. She has no wheezes. She has no rales.  GI: Soft. Bowel sounds are normal. There is no tenderness.  Neurological: She is alert and oriented to person, place, and time. No cranial nerve deficit. Coordination normal.  Skin: Skin is warm and dry.  Psychiatric: She has a normal mood and affect. Her behavior is normal. Judgment and thought content normal.    Vital signs in last 24 hours: Temp:  [98 F (36.7 C)] 98 F (36.7 C) (11/07 1031) Pulse Rate:  [78] 78 (11/07 1031) BP: (130)/(77) 130/77 (11/07 1031) Weight:  [175 lb (79.4 kg)] 175 lb (79.4 kg) (11/07 1031)  Labs:   Estimated body mass index is 29.12 kg/m as calculated from the following:   Height as of 07/05/17: 5\' 5"  (1.651 m).   Weight as of 07/05/17: 175 lb (79.4 kg).   Imaging Review Plain radiographs demonstrate moderate degenerative joint disease of the left knee(s). The overall alignment ismild varus. The bone quality appears to be  good for age and reported activity level.  Assessment/Plan:  End stage arthritis, left knee   The patient history, physical examination, clinical judgment of the provider and imaging studies are consistent with end stage degenerative joint disease of the left knee(s) and total knee arthroplasty is deemed medically necessary. The treatment options including medical management, injection therapy arthroscopy and arthroplasty were discussed at length. The risks and benefits of total knee arthroplasty were presented and reviewed. The risks due to aseptic loosening, infection, stiffness, patella tracking problems, thromboembolic complications and other imponderables were discussed. The patient acknowledged the explanation, agreed to proceed with the plan and consent was signed. Patient is being admitted for inpatient treatment for surgery, pain control, PT, OT, prophylactic antibiotics, VTE prophylaxis, progressive ambulation and ADL's and discharge planning. The patient is planning to be discharged home with home health services   Oris DroneBrian D. Aleda Granaetrarca, PA-C [facility] [phone]  07/05/2017 12:56 PM

## 2017-07-07 IMAGING — CR DG CHEST 2V
2 series · 2 of 2 positions shown · non-contrast
Comparison: January 23, 2014

CLINICAL DATA: Preoperative evaluation for total knee replacement

EXAM:
CHEST  2 VIEW

[w chest pa]
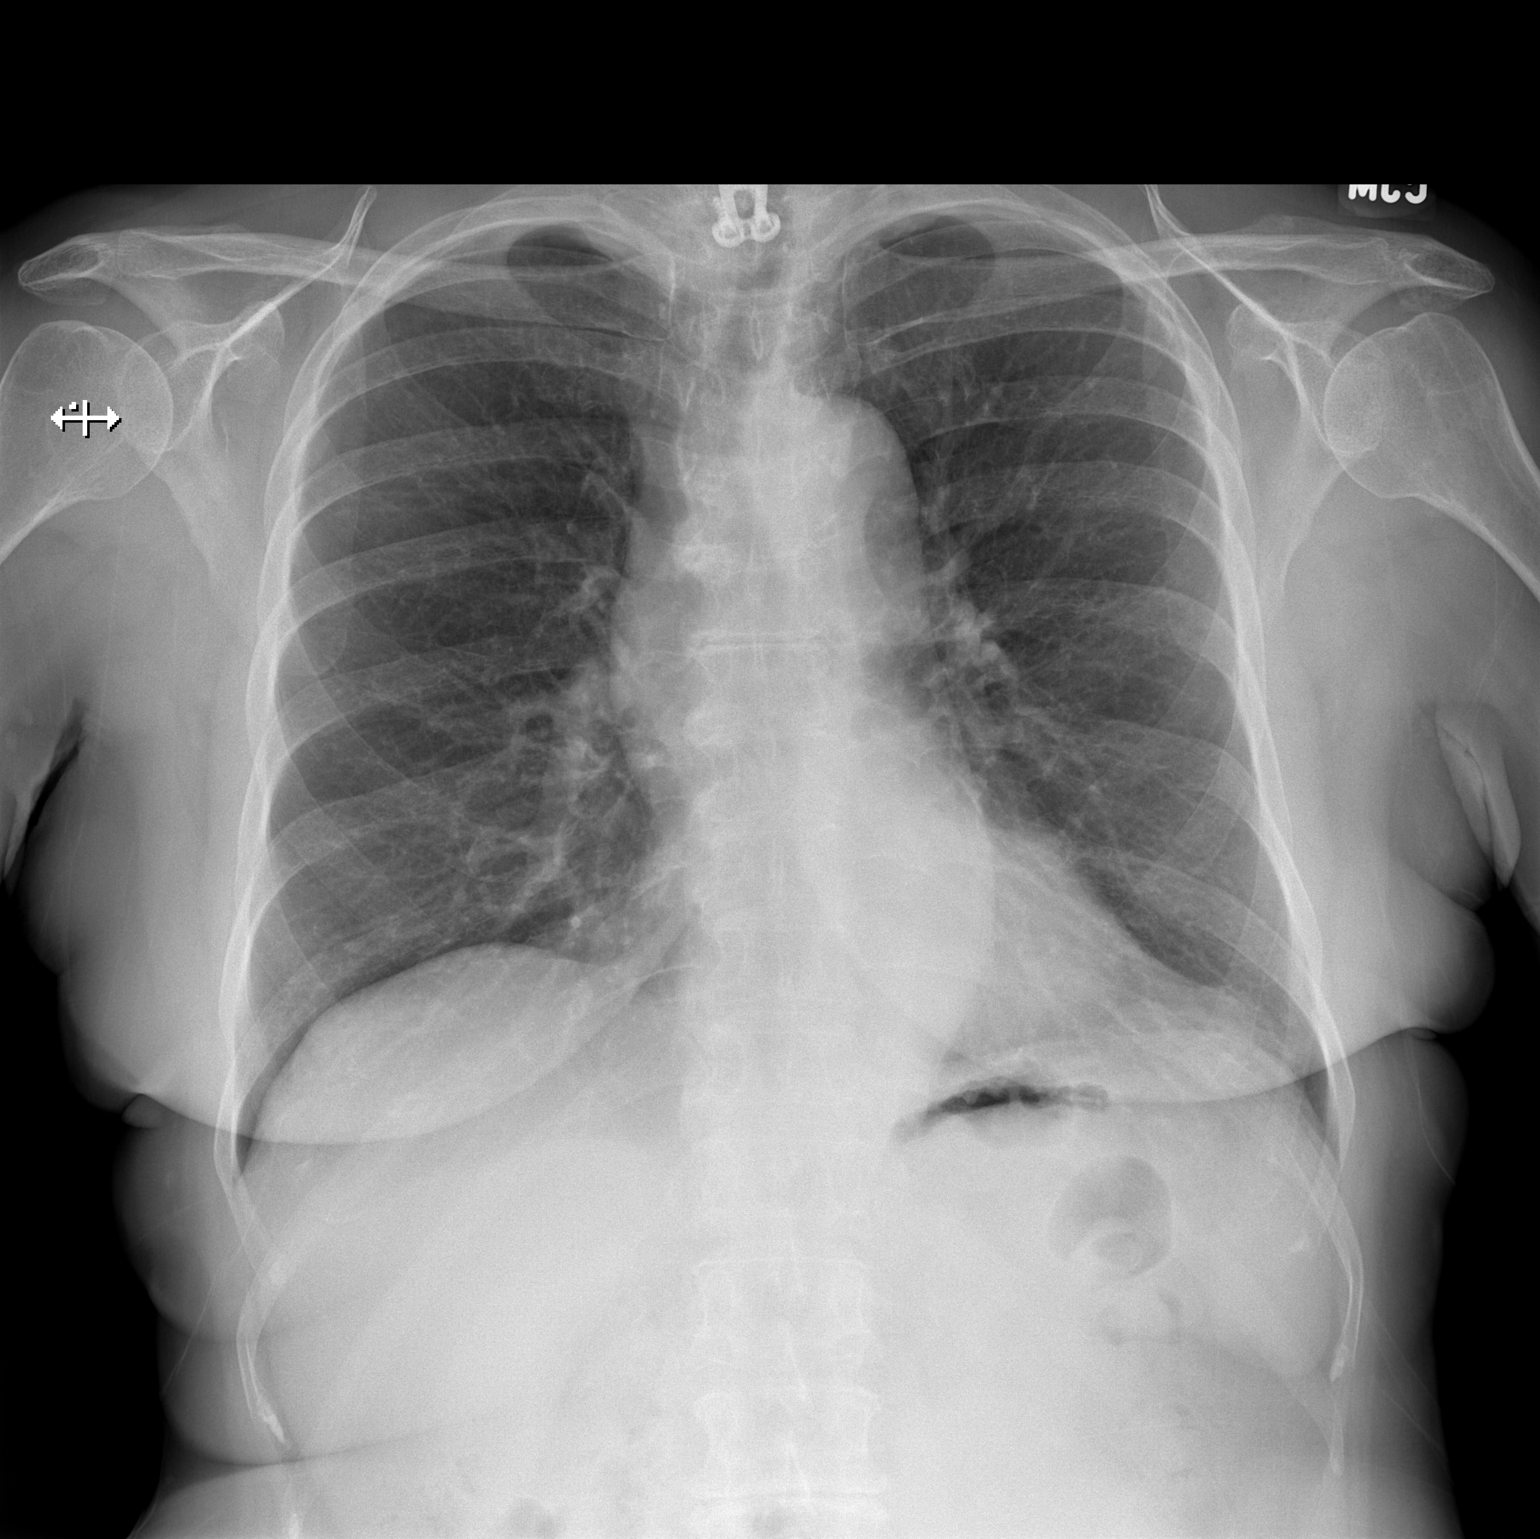

[w chest lat]
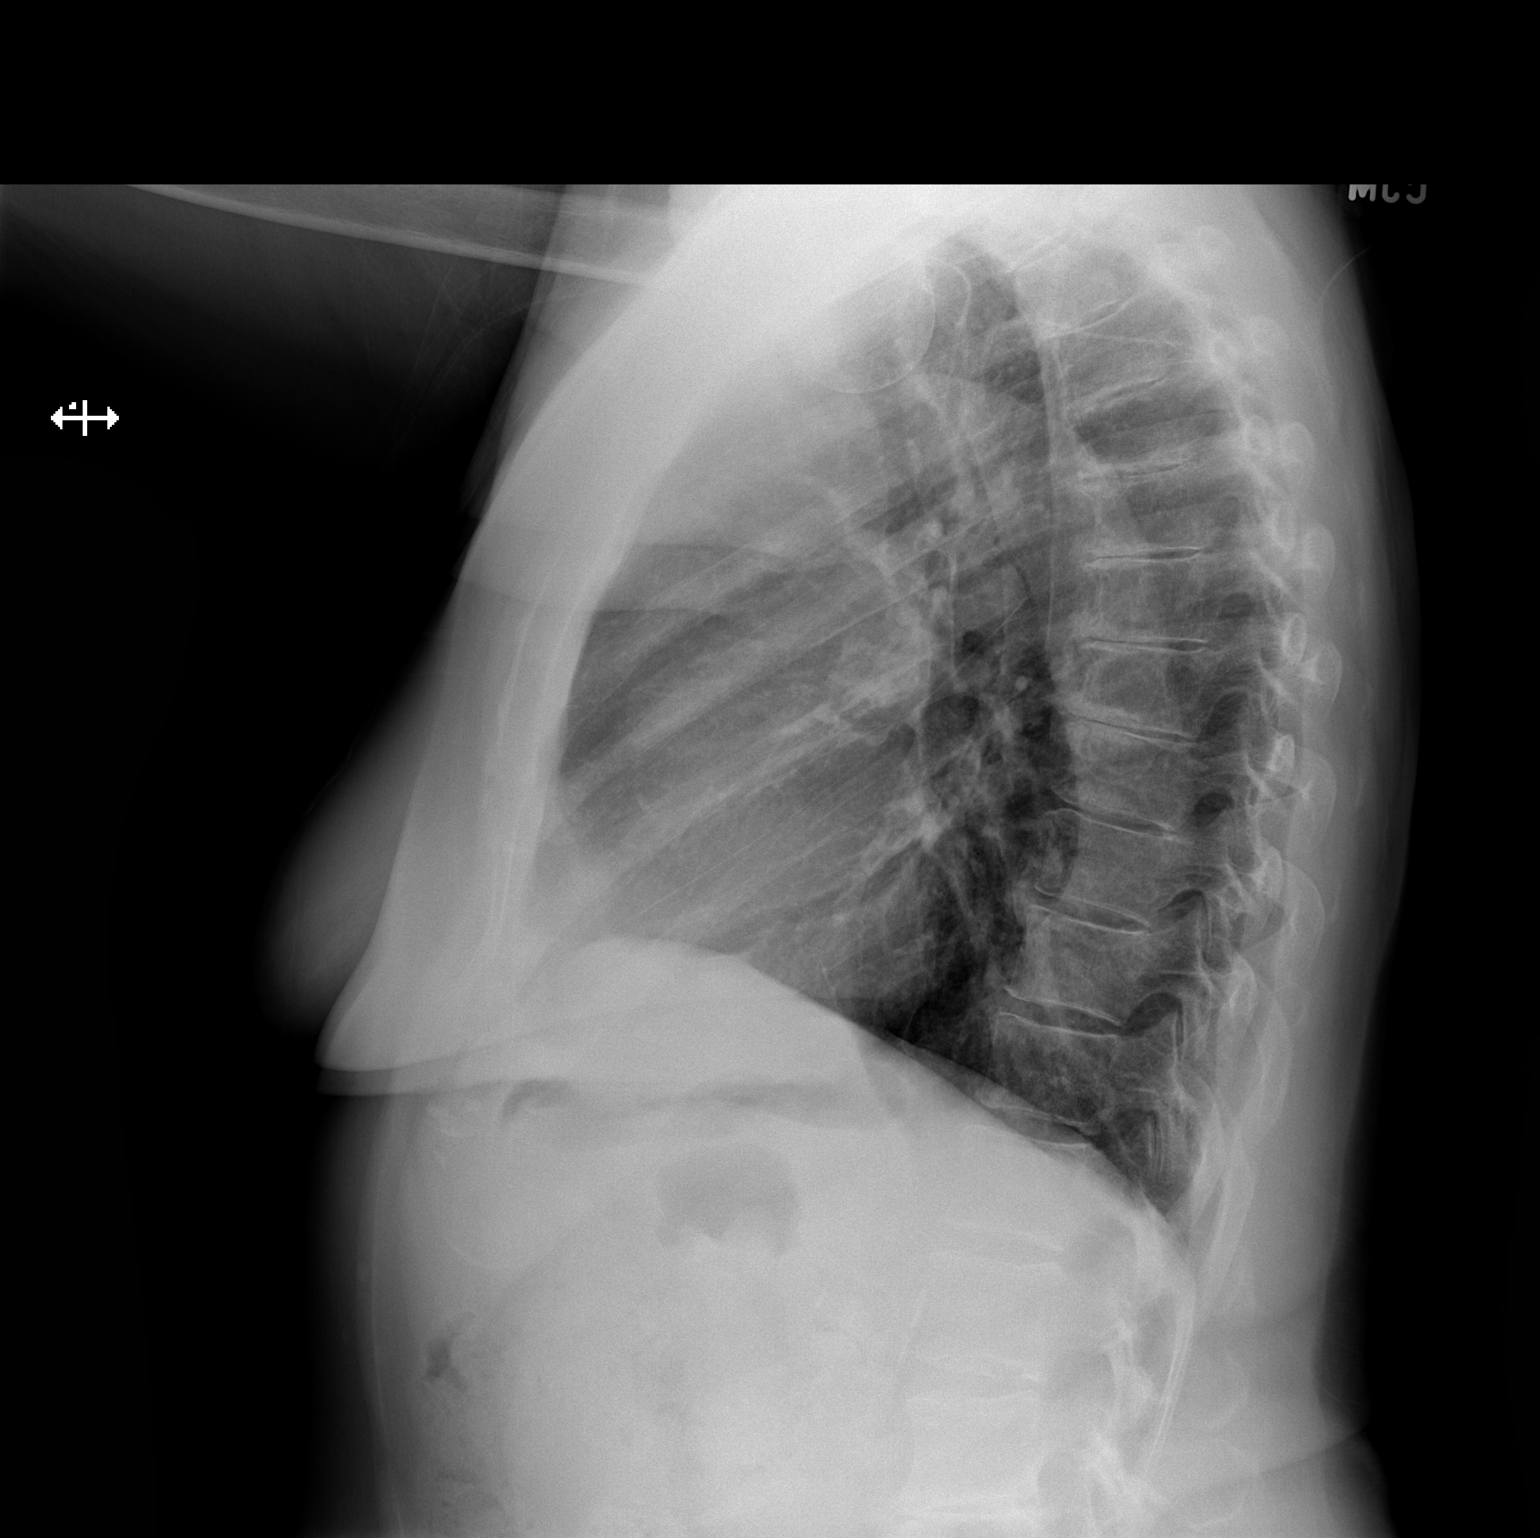

[2 of 2 positions shown; findings below may reference images not displayed]

FINDINGS: There is no edema or consolidation. Heart size and pulmonary
vascularity are normal. No adenopathy. There is postoperative change
in the lower cervical spine. There is degenerative change in the
thoracic spine.
IMPRESSION: No edema or consolidation.

## 2017-07-10 ENCOUNTER — Inpatient Hospital Stay (HOSPITAL_COMMUNITY)
Admission: RE | Admit: 2017-07-10 | Discharge: 2017-07-10 | Disposition: A | Discharge status: Home / Self Care | Payer: Medicare Other | Source: Ambulatory Visit | Attending: Orthopaedic Surgery | Admitting: Orthopaedic Surgery

## 2017-07-10 ENCOUNTER — Telehealth: Payer: Self-pay

## 2017-07-10 NOTE — Telephone Encounter (Signed)
Patient was a no show for preadmission testing today, they called both numbers on file for patient and left messages. I also called the patient and left a message. Patient is scheduled for surgery on 07/18/17.

## 2017-07-10 NOTE — Pre-Procedure Instructions (Signed)
Kristen MooresBarbara E Leonard  07/10/2017      [id number] [place] [place] [facility] [address] [place] [address] Phone: [phone] Fax: [phone]    Your procedure is scheduled on Tuesday, November 20th   [facility] at 5:30 AM             (posted surgery time 7:15a - 9:38a)   Call this number if you have problems the MORNING of surgery:  [phone]   Remember:               4-5 days prior to surgery, STOP TAKING any Vitamins, Herbal Supplements, Anti-inflammatories.   Do not eat food or drink liquids after midnight Monday.   Take these medicines the morning of surgery with A SIP OF WATER : Levothyroxine.  Please use your inhaler that morning.   Do not wear jewelry, make-up or nail polish.  Do not wear lotions, powders,  perfumes, or deoderant.  Do not shave 48 hours prior to surgery.    Do not bring valuables to the hospital.  [facility] is not responsible for any belongings or valuables.  Contacts, dentures or bridgework may not be worn into surgery.  Leave your suitcase in the car.  After surgery it may be brought to your room.  For patients admitted to the hospital, discharge time will be determined by your treatment team.  Please read over the following fact sheets that you were given. Pain Booklet, MRSA Information and Surgical Site Infection Prevention    [place]- Preparing For Surgery  Before surgery, you can play an important role. Because skin is not sterile, your skin needs to be as free of germs as possible. You can reduce the number of germs on your skin by washing with CHG (chlorahexidine gluconate) Soap before surgery.  CHG is an antiseptic cleaner which kills germs and bonds with the skin to continue killing germs even after washing.  Please do not use if you have an allergy to CHG or antibacterial soaps. If your skin becomes reddened/irritated stop using the  CHG.  Do not shave (including legs and underarms) for at least 48 hours prior to first CHG shower. It is OK to shave your face.  Please follow these instructions carefully.   1. Shower the NIGHT BEFORE SURGERY and the MORNING OF SURGERY with CHG.   2. If you chose to wash your hair, wash your hair first as usual with your normal shampoo.  3. After you shampoo, rinse your hair and body thoroughly to remove the shampoo.  4. Use CHG as you would any other liquid soap. You can apply CHG directly to the skin and wash gently with a scrungie or a clean washcloth.   5. Apply the CHG Soap to your body ONLY FROM THE NECK DOWN.  Do not use on open wounds or open sores. Avoid contact with your eyes, ears, mouth and genitals (private parts). Wash Face and genitals (private parts)  with your normal soap.  6. Wash thoroughly, paying special attention to the area where your surgery will be performed.  7. Thoroughly rinse your body with warm water from the neck down.  8. DO NOT shower/wash with your normal soap after using and rinsing off the CHG Soap.  9. Pat yourself dry with a CLEAN TOWEL.  10. Wear CLEAN PAJAMAS to bed the night before surgery, wear comfortable clothes the morning of  surgery  11. Place CLEAN SHEETS on your bed the night of your first shower and DO NOT SLEEP WITH PETS.    Day of Surgery: Do not apply any deodorants/lotions. Please wear clean clothes to the hospital/surgery center.

## 2017-07-12 ENCOUNTER — Other Ambulatory Visit: Payer: Self-pay

## 2017-07-12 ENCOUNTER — Encounter (HOSPITAL_COMMUNITY): Payer: Self-pay

## 2017-07-12 ENCOUNTER — Encounter (HOSPITAL_COMMUNITY)
Admission: RE | Admit: 2017-07-12 | Discharge: 2017-07-12 | Disposition: A | Discharge status: Home / Self Care | Payer: Medicare Other | Source: Ambulatory Visit | Attending: Orthopaedic Surgery | Admitting: Orthopaedic Surgery

## 2017-07-12 DIAGNOSIS — Z7901 Long term (current) use of anticoagulants: Secondary | ICD-10-CM | POA: Diagnosis not present

## 2017-07-12 DIAGNOSIS — M1712 Unilateral primary osteoarthritis, left knee: Secondary | ICD-10-CM | POA: Insufficient documentation

## 2017-07-12 DIAGNOSIS — Z01818 Encounter for other preprocedural examination: Secondary | ICD-10-CM | POA: Diagnosis present

## 2017-07-12 DIAGNOSIS — Z79899 Other long term (current) drug therapy: Secondary | ICD-10-CM | POA: Diagnosis not present

## 2017-07-12 LAB — COMPREHENSIVE METABOLIC PANEL
ALT: 17 U/L (ref 14–54)
AST: 18 U/L (ref 15–41)
Albumin: 3.7 g/dL (ref 3.5–5.0)
Alkaline Phosphatase: 108 U/L (ref 38–126)
Anion gap: 7 (ref 5–15)
BUN: 14 mg/dL (ref 6–20)
CHLORIDE: 106 mmol/L (ref 101–111)
CO2: 27 mmol/L (ref 22–32)
Calcium: 9.5 mg/dL (ref 8.9–10.3)
Creatinine, Ser: 0.78 mg/dL (ref 0.44–1.00)
GFR calc non Af Amer: >60 mL/min (ref >60)
Glucose, Bld: 124 mg/dL — ABNORMAL HIGH (ref 65–99)
POTASSIUM: 3.8 mmol/L (ref 3.5–5.1)
Sodium: 140 mmol/L (ref 135–145)
Total Bilirubin: 0.8 mg/dL (ref 0.3–1.2)
Total Protein: 6.3 g/dL — ABNORMAL LOW (ref 6.5–8.1)

## 2017-07-12 LAB — CBC WITH DIFFERENTIAL/PLATELET
BASOS ABS: 0.1 10*3/uL (ref 0.0–0.1)
Basophils Relative: 1 %
EOS PCT: 4 %
Eosinophils Absolute: 0.2 10*3/uL (ref 0.0–0.7)
HCT: 39 % (ref 36.0–46.0)
Hemoglobin: 12.9 g/dL (ref 12.0–15.0)
LYMPHS PCT: 40 %
Lymphs Abs: 1.7 10*3/uL (ref 0.7–4.0)
MCH: 29.2 pg (ref 26.0–34.0)
MCHC: 33.1 g/dL (ref 30.0–36.0)
MCV: 88.2 fL (ref 78.0–100.0)
MONO ABS: 0.2 10*3/uL (ref 0.1–1.0)
Monocytes Relative: 5 %
Neutro Abs: 2.2 10*3/uL (ref 1.7–7.7)
Neutrophils Relative %: 50 %
PLATELETS: 221 10*3/uL (ref 150–400)
RBC: 4.42 MIL/uL (ref 3.87–5.11)
RDW: 14.1 % (ref 11.5–15.5)
WBC: 4.3 10*3/uL (ref 4.0–10.5)

## 2017-07-12 LAB — TYPE AND SCREEN
ABO/RH(D): B NEG
ANTIBODY SCREEN: NEGATIVE

## 2017-07-12 LAB — URINALYSIS, ROUTINE W REFLEX MICROSCOPIC
BILIRUBIN URINE: NEGATIVE
GLUCOSE, UA: NEGATIVE mg/dL
HGB URINE DIPSTICK: NEGATIVE
KETONES UR: NEGATIVE mg/dL
Leukocytes, UA: NEGATIVE
Nitrite: NEGATIVE
PH: 6 (ref 5.0–8.0)
Protein, ur: NEGATIVE mg/dL
Specific Gravity, Urine: 1.015 (ref 1.005–1.030)

## 2017-07-12 LAB — PROTIME-INR
INR: 0.98
PROTHROMBIN TIME: 12.9 s (ref 11.4–15.2)

## 2017-07-12 LAB — APTT: APTT: 30 s (ref 24–36)

## 2017-07-12 LAB — SURGICAL PCR SCREEN
MRSA, PCR: NEGATIVE
STAPHYLOCOCCUS AUREUS: NEGATIVE

## 2017-07-12 NOTE — Progress Notes (Signed)
PCP - Blair Heysobert Ehinger Cardiologist - Dr SwazilandJordan, has not seen since 2013, denies any cardiac history  Chest x-ray - 02/14/17 EKG - 02/28/17 Stress Test - 2009 ECHO - 2007  Sleep Study - 07/07/2003, does not wear CPAP  Patient denies shortness of breath, fever, cough and chest pain at PAT appointment  Patient verbalized understanding of instructions that were given to them at the PAT appointment. Patient was also instructed that they will need to review over the PAT instructions again at home before surgery.

## 2017-07-13 ENCOUNTER — Telehealth (INDEPENDENT_AMBULATORY_CARE_PROVIDER_SITE_OTHER): Payer: Self-pay | Admitting: Orthopaedic Surgery

## 2017-07-13 LAB — URINE CULTURE

## 2017-07-13 NOTE — Telephone Encounter (Signed)
Call her and tell her it is not advised to have teeth cleaned this close to TKR.  Post pone till 3 months post op

## 2017-07-13 NOTE — Telephone Encounter (Signed)
Please see note.Marland Kitchen.Marland Kitchen.Surgery is Left TKA on 07/18/17.

## 2017-07-13 NOTE — Telephone Encounter (Signed)
Patient went to dentist yesterday for a cleaning, and dentist was concerned about patient possibly needing an antibiotic before surgery. [facility] [address] Please call to advise.

## 2017-07-17 MED ORDER — SODIUM CHLORIDE 0.9 % IV SOLN: INTRAVENOUS | Status: DC

## 2017-07-17 MED ORDER — TRANEXAMIC ACID 1000 MG/10ML IV SOLN
2000.0000 mg | INTRAVENOUS | Status: AC
  Administered 2017-07-18: 2000 mg via TOPICAL
  Filled 2017-07-17: qty 20

## 2017-07-17 MED ORDER — ACETAMINOPHEN 10 MG/ML IV SOLN
1000.0000 mg | Freq: Once | INTRAVENOUS | Status: AC
  Administered 2017-07-18: 1000 mg via INTRAVENOUS
  Filled 2017-07-17: qty 100

## 2017-07-17 MED ORDER — VANCOMYCIN HCL IN DEXTROSE 1-5 GM/200ML-% IV SOLN
1000.0000 mg | INTRAVENOUS | Status: AC
  Administered 2017-07-18: 1000 mg via INTRAVENOUS
  Filled 2017-07-17: qty 200

## 2017-07-17 NOTE — Anesthesia Preprocedure Evaluation (Addendum)
Anesthesia Evaluation  Patient identified by MRN, date of birth, ID band Patient awake    Reviewed: Allergy & Precautions, NPO status , Patient's Chart, lab work & pertinent test results  Airway Mallampati: II  TM Distance: >3 FB Neck ROM: Full    Dental no notable dental hx.    Pulmonary sleep apnea ,    Pulmonary exam normal breath sounds clear to auscultation       Cardiovascular negative cardio ROS Normal cardiovascular exam Rhythm:Regular Rate:Normal     Neuro/Psych negative neurological ROS  negative psych ROS   GI/Hepatic Neg liver ROS, hiatal hernia, GERD  ,  Endo/Other  Hypothyroidism   Renal/GU negative Renal ROS  negative genitourinary   Musculoskeletal negative musculoskeletal ROS (+)   Abdominal   Peds negative pediatric ROS (+)  Hematology negative hematology ROS (+)   Anesthesia Other Findings   Reproductive/Obstetrics negative OB ROS                             Anesthesia Physical  Anesthesia Plan  ASA: III  Anesthesia Plan: Spinal   Post-op Pain Management:  Regional for Post-op pain   Induction:   PONV Risk Score and Plan: 2 and Ondansetron and Dexamethasone  Airway Management Planned: Simple Face Mask, Nasal Cannula and Natural Airway  Additional Equipment:   Intra-op Plan:   Post-operative Plan:   Informed Consent: I have reviewed the patients History and Physical, chart, labs and discussed the procedure including the risks, benefits and alternatives for the proposed anesthesia with the patient or authorized representative who has indicated his/her understanding and acceptance.   Dental advisory given  Plan Discussed with:   Anesthesia Plan Comments:         Anesthesia Quick Evaluation

## 2017-07-17 NOTE — Telephone Encounter (Signed)
LVMOM to not have a cleaning so close to surgery.

## 2017-07-18 ENCOUNTER — Ambulatory Visit (HOSPITAL_COMMUNITY)
Admission: AD | Admit: 2017-07-18 | Discharge: 2017-07-19 | Disposition: A | Discharge status: Home / Self Care | Payer: Medicare Other | Source: Ambulatory Visit | Attending: Orthopaedic Surgery | Admitting: Orthopaedic Surgery

## 2017-07-18 ENCOUNTER — Encounter (HOSPITAL_COMMUNITY): Payer: Self-pay | Admitting: *Deleted

## 2017-07-18 ENCOUNTER — Other Ambulatory Visit: Payer: Self-pay

## 2017-07-18 ENCOUNTER — Ambulatory Visit (HOSPITAL_COMMUNITY): Payer: Medicare Other | Admitting: Anesthesiology

## 2017-07-18 ENCOUNTER — Encounter (HOSPITAL_COMMUNITY)
Admission: AD | Disposition: A | Discharge status: Home / Self Care | Payer: Self-pay | Source: Ambulatory Visit | Attending: Orthopaedic Surgery

## 2017-07-18 DIAGNOSIS — Z90710 Acquired absence of both cervix and uterus: Secondary | ICD-10-CM | POA: Diagnosis not present

## 2017-07-18 DIAGNOSIS — F329 Major depressive disorder, single episode, unspecified: Secondary | ICD-10-CM | POA: Diagnosis not present

## 2017-07-18 DIAGNOSIS — Z880 Allergy status to penicillin: Secondary | ICD-10-CM | POA: Diagnosis not present

## 2017-07-18 DIAGNOSIS — Z79899 Other long term (current) drug therapy: Secondary | ICD-10-CM | POA: Insufficient documentation

## 2017-07-18 DIAGNOSIS — M65862 Other synovitis and tenosynovitis, left lower leg: Secondary | ICD-10-CM | POA: Diagnosis not present

## 2017-07-18 DIAGNOSIS — M25762 Osteophyte, left knee: Secondary | ICD-10-CM | POA: Insufficient documentation

## 2017-07-18 DIAGNOSIS — E039 Hypothyroidism, unspecified: Secondary | ICD-10-CM | POA: Insufficient documentation

## 2017-07-18 DIAGNOSIS — M25462 Effusion, left knee: Secondary | ICD-10-CM | POA: Diagnosis not present

## 2017-07-18 DIAGNOSIS — K3184 Gastroparesis: Secondary | ICD-10-CM | POA: Insufficient documentation

## 2017-07-18 DIAGNOSIS — M1712 Unilateral primary osteoarthritis, left knee: Secondary | ICD-10-CM | POA: Insufficient documentation

## 2017-07-18 DIAGNOSIS — G4730 Sleep apnea, unspecified: Secondary | ICD-10-CM | POA: Diagnosis not present

## 2017-07-18 DIAGNOSIS — Z96652 Presence of left artificial knee joint: Secondary | ICD-10-CM

## 2017-07-18 DIAGNOSIS — K219 Gastro-esophageal reflux disease without esophagitis: Secondary | ICD-10-CM | POA: Diagnosis not present

## 2017-07-18 HISTORY — PX: TOTAL KNEE ARTHROPLASTY: SHX125

## 2017-07-18 SURGERY — ARTHROPLASTY, KNEE, TOTAL
Anesthesia: Spinal | Laterality: Left

## 2017-07-18 MED ORDER — MEPERIDINE HCL 25 MG/ML IJ SOLN: 6.2500 mg | INTRAMUSCULAR | Status: DC | PRN

## 2017-07-18 MED ORDER — METOCLOPRAMIDE HCL 5 MG PO TABS: 5.0000 mg | ORAL_TABLET | Freq: Three times a day (TID) | ORAL | Status: DC | PRN

## 2017-07-18 MED ORDER — METHOCARBAMOL 1000 MG/10ML IJ SOLN: 500.0000 mg | Freq: Four times a day (QID) | INTRAVENOUS | Status: DC | PRN

## 2017-07-18 MED ORDER — BUPIVACAINE IN DEXTROSE 0.75-8.25 % IT SOLN
INTRATHECAL | Status: DC | PRN
  Administered 2017-07-18: 11 mg via INTRATHECAL

## 2017-07-18 MED ORDER — KETOROLAC TROMETHAMINE 15 MG/ML IJ SOLN
7.5000 mg | Freq: Four times a day (QID) | INTRAMUSCULAR | Status: AC
  Administered 2017-07-18 – 2017-07-19 (×4): 7.5 mg via INTRAVENOUS
  Filled 2017-07-18 (×4): qty 1

## 2017-07-18 MED ORDER — LEVOTHYROXINE SODIUM 75 MCG PO TABS
75.0000 ug | ORAL_TABLET | Freq: Every day | ORAL | Status: DC
  Administered 2017-07-19: 75 ug via ORAL
  Filled 2017-07-18: qty 1

## 2017-07-18 MED ORDER — MENTHOL 3 MG MT LOZG: 1.0000 | LOZENGE | OROMUCOSAL | Status: DC | PRN

## 2017-07-18 MED ORDER — POLYETHYLENE GLYCOL 3350 17 G PO PACK: 17.0000 g | PACK | Freq: Every day | ORAL | Status: DC | PRN

## 2017-07-18 MED ORDER — ROPIVACAINE HCL 7.5 MG/ML IJ SOLN
INTRAMUSCULAR | Status: DC | PRN
  Administered 2017-07-18: 25 mL via PERINEURAL

## 2017-07-18 MED ORDER — SODIUM CHLORIDE 0.9 % IV SOLN
INTRAVENOUS | Status: DC
  Administered 2017-07-19: 05:00:00 via INTRAVENOUS

## 2017-07-18 MED ORDER — ACETAMINOPHEN 10 MG/ML IV SOLN
1000.0000 mg | Freq: Four times a day (QID) | INTRAVENOUS | Status: DC
  Administered 2017-07-18 – 2017-07-19 (×3): 1000 mg via INTRAVENOUS
  Filled 2017-07-18 (×3): qty 100

## 2017-07-18 MED ORDER — PROPOFOL 500 MG/50ML IV EMUL
INTRAVENOUS | Status: DC | PRN
  Administered 2017-07-18: 50 ug/kg/min via INTRAVENOUS

## 2017-07-18 MED ORDER — BUPIVACAINE-EPINEPHRINE 0.25% -1:200000 IJ SOLN
INTRAMUSCULAR | Status: DC | PRN
  Administered 2017-07-18: 60 mL

## 2017-07-18 MED ORDER — FENTANYL CITRATE (PF) 100 MCG/2ML IJ SOLN
INTRAMUSCULAR | Status: DC | PRN
  Administered 2017-07-18 (×2): 50 ug via INTRAVENOUS

## 2017-07-18 MED ORDER — DOCUSATE SODIUM 100 MG PO CAPS
100.0000 mg | ORAL_CAPSULE | Freq: Two times a day (BID) | ORAL | Status: DC
  Administered 2017-07-18 – 2017-07-19 (×2): 100 mg via ORAL
  Filled 2017-07-18 (×2): qty 1

## 2017-07-18 MED ORDER — OXYCODONE HCL 5 MG PO TABS
5.0000 mg | ORAL_TABLET | ORAL | Status: DC | PRN
  Administered 2017-07-18: 5 mg via ORAL
  Filled 2017-07-18: qty 1

## 2017-07-18 MED ORDER — OXYCODONE HCL 5 MG PO TABS: 10.0000 mg | ORAL_TABLET | ORAL | Status: DC | PRN

## 2017-07-18 MED ORDER — CHLORHEXIDINE GLUCONATE 4 % EX LIQD: 60.0000 mL | Freq: Once | CUTANEOUS | Status: DC

## 2017-07-18 MED ORDER — PROPOFOL 10 MG/ML IV BOLUS
INTRAVENOUS | Status: AC
  Filled 2017-07-18: qty 20

## 2017-07-18 MED ORDER — LIDOCAINE 2% (20 MG/ML) 5 ML SYRINGE
INTRAMUSCULAR | Status: AC
  Filled 2017-07-18: qty 5

## 2017-07-18 MED ORDER — MAGNESIUM CITRATE PO SOLN: 1.0000 | Freq: Once | ORAL | Status: DC | PRN

## 2017-07-18 MED ORDER — FENTANYL CITRATE (PF) 100 MCG/2ML IJ SOLN
25.0000 ug | INTRAMUSCULAR | Status: DC | PRN
  Administered 2017-07-18: 25 ug via INTRAVENOUS

## 2017-07-18 MED ORDER — BISACODYL 10 MG RE SUPP: 10.0000 mg | Freq: Every day | RECTAL | Status: DC | PRN

## 2017-07-18 MED ORDER — LIDOCAINE HCL (CARDIAC) 20 MG/ML IV SOLN
INTRAVENOUS | Status: DC | PRN
  Administered 2017-07-18: 60 mg via INTRAVENOUS

## 2017-07-18 MED ORDER — 0.9 % SODIUM CHLORIDE (POUR BTL) OPTIME
TOPICAL | Status: DC | PRN
  Administered 2017-07-18: 1000 mL

## 2017-07-18 MED ORDER — RIVAROXABAN 10 MG PO TABS
10.0000 mg | ORAL_TABLET | Freq: Every day | ORAL | Status: DC
  Administered 2017-07-18: 10 mg via ORAL
  Filled 2017-07-18: qty 1

## 2017-07-18 MED ORDER — BUPIVACAINE LIPOSOME 1.3 % IJ SUSP
20.0000 mL | INTRAMUSCULAR | Status: DC
  Filled 2017-07-18: qty 20

## 2017-07-18 MED ORDER — METHOCARBAMOL 500 MG PO TABS
500.0000 mg | ORAL_TABLET | Freq: Four times a day (QID) | ORAL | Status: DC | PRN
  Administered 2017-07-18 (×2): 500 mg via ORAL
  Filled 2017-07-18 (×2): qty 1

## 2017-07-18 MED ORDER — FENTANYL CITRATE (PF) 100 MCG/2ML IJ SOLN
INTRAMUSCULAR | Status: AC
  Filled 2017-07-18: qty 2

## 2017-07-18 MED ORDER — DIPHENHYDRAMINE HCL 12.5 MG/5ML PO ELIX: 12.5000 mg | ORAL_SOLUTION | ORAL | Status: DC | PRN

## 2017-07-18 MED ORDER — DEXAMETHASONE SODIUM PHOSPHATE 10 MG/ML IJ SOLN
INTRAMUSCULAR | Status: DC | PRN
  Administered 2017-07-18: 10 mg via INTRAVENOUS

## 2017-07-18 MED ORDER — ALBUTEROL SULFATE (2.5 MG/3ML) 0.083% IN NEBU: 3.0000 mL | INHALATION_SOLUTION | Freq: Four times a day (QID) | RESPIRATORY_TRACT | Status: DC | PRN

## 2017-07-18 MED ORDER — BUPIVACAINE-EPINEPHRINE (PF) 0.25% -1:200000 IJ SOLN
INTRAMUSCULAR | Status: AC
  Filled 2017-07-18: qty 30

## 2017-07-18 MED ORDER — SUCCINYLCHOLINE CHLORIDE 200 MG/10ML IV SOSY
PREFILLED_SYRINGE | INTRAVENOUS | Status: AC
  Filled 2017-07-18: qty 10

## 2017-07-18 MED ORDER — VANCOMYCIN HCL IN DEXTROSE 1-5 GM/200ML-% IV SOLN
1000.0000 mg | Freq: Two times a day (BID) | INTRAVENOUS | Status: AC
  Administered 2017-07-18: 1000 mg via INTRAVENOUS
  Filled 2017-07-18: qty 200

## 2017-07-18 MED ORDER — ONDANSETRON HCL 4 MG PO TABS: 4.0000 mg | ORAL_TABLET | Freq: Four times a day (QID) | ORAL | Status: DC | PRN

## 2017-07-18 MED ORDER — ONDANSETRON HCL 4 MG/2ML IJ SOLN: 4.0000 mg | Freq: Four times a day (QID) | INTRAMUSCULAR | Status: DC | PRN

## 2017-07-18 MED ORDER — SODIUM CHLORIDE 0.9 % IJ SOLN
INTRAMUSCULAR | Status: DC | PRN
  Administered 2017-07-18: 40 mL via INTRAVENOUS

## 2017-07-18 MED ORDER — PHENOL 1.4 % MT LIQD: 1.0000 | OROMUCOSAL | Status: DC | PRN

## 2017-07-18 MED ORDER — LACTATED RINGERS IV SOLN
INTRAVENOUS | Status: DC | PRN
  Administered 2017-07-18 (×2): via INTRAVENOUS

## 2017-07-18 MED ORDER — ROCURONIUM BROMIDE 10 MG/ML (PF) SYRINGE
PREFILLED_SYRINGE | INTRAVENOUS | Status: AC
  Filled 2017-07-18: qty 5

## 2017-07-18 MED ORDER — ONDANSETRON HCL 4 MG/2ML IJ SOLN
INTRAMUSCULAR | Status: AC
  Filled 2017-07-18: qty 2

## 2017-07-18 MED ORDER — PHENYLEPHRINE 40 MCG/ML (10ML) SYRINGE FOR IV PUSH (FOR BLOOD PRESSURE SUPPORT)
PREFILLED_SYRINGE | INTRAVENOUS | Status: AC
  Filled 2017-07-18: qty 10

## 2017-07-18 MED ORDER — ALUM & MAG HYDROXIDE-SIMETH 200-200-20 MG/5ML PO SUSP: 30.0000 mL | ORAL | Status: DC | PRN

## 2017-07-18 MED ORDER — HYDROMORPHONE HCL 1 MG/ML IJ SOLN: 0.5000 mg | INTRAMUSCULAR | Status: DC | PRN

## 2017-07-18 MED ORDER — DEXAMETHASONE SODIUM PHOSPHATE 10 MG/ML IJ SOLN
INTRAMUSCULAR | Status: AC
  Filled 2017-07-18: qty 1

## 2017-07-18 MED ORDER — METOCLOPRAMIDE HCL 5 MG/ML IJ SOLN: 5.0000 mg | Freq: Three times a day (TID) | INTRAMUSCULAR | Status: DC | PRN

## 2017-07-18 MED ORDER — EPHEDRINE 5 MG/ML INJ
INTRAVENOUS | Status: AC
  Filled 2017-07-18: qty 10

## 2017-07-18 MED ORDER — PHENYLEPHRINE HCL 10 MG/ML IJ SOLN
INTRAMUSCULAR | Status: DC | PRN
  Administered 2017-07-18: 40 ug/min via INTRAVENOUS

## 2017-07-18 MED ORDER — SODIUM CHLORIDE 0.9 % IR SOLN
Status: DC | PRN
  Administered 2017-07-18: 3000 mL

## 2017-07-18 SURGICAL SUPPLY — 56 items
BAG DECANTER FOR FLEXI CONT (MISCELLANEOUS) ×3 IMPLANT
BANDAGE ESMARK 6X9 LF (GAUZE/BANDAGES/DRESSINGS) ×1 IMPLANT
BLADE SAGITTAL 25.0X1.19X90 (BLADE) ×2 IMPLANT
BLADE SAGITTAL 25.0X1.19X90MM (BLADE) ×1
BNDG ESMARK 6X9 LF (GAUZE/BANDAGES/DRESSINGS) ×3
BOWL SMART MIX CTS (DISPOSABLE) ×3 IMPLANT
CAP KNEE TOTAL 3 SIGMA ×3 IMPLANT
CEMENT HV SMART SET (Cement) ×6 IMPLANT
COVER SURGICAL LIGHT HANDLE (MISCELLANEOUS) ×3 IMPLANT
CUFF TOURNIQUET SINGLE 34IN LL (TOURNIQUET CUFF) ×3 IMPLANT
CUFF TOURNIQUET SINGLE 44IN (TOURNIQUET CUFF) IMPLANT
DECANTER SPIKE VIAL GLASS SM (MISCELLANEOUS) ×3 IMPLANT
DRAPE EXTREMITY T 121X128X90 (DRAPE) ×3 IMPLANT
DRAPE HALF SHEET 40X57 (DRAPES) ×6 IMPLANT
DRSG ADAPTIC 3X8 NADH LF (GAUZE/BANDAGES/DRESSINGS) ×3 IMPLANT
DRSG PAD ABDOMINAL 8X10 ST (GAUZE/BANDAGES/DRESSINGS) ×6 IMPLANT
DURAPREP 26ML APPLICATOR (WOUND CARE) ×6 IMPLANT
ELECT CAUTERY BLADE 6.4 (BLADE) ×3 IMPLANT
ELECT REM PT RETURN 9FT ADLT (ELECTROSURGICAL) ×3
ELECTRODE REM PT RTRN 9FT ADLT (ELECTROSURGICAL) ×1 IMPLANT
EVACUATOR 1/8 PVC DRAIN (DRAIN) IMPLANT
FACESHIELD WRAPAROUND (MASK) ×6 IMPLANT
GAUZE SPONGE 4X4 12PLY STRL (GAUZE/BANDAGES/DRESSINGS) ×3 IMPLANT
GAUZE SPONGE 4X4 12PLY STRL LF (GAUZE/BANDAGES/DRESSINGS) ×3 IMPLANT
GLOVE BIOGEL PI IND STRL 8 (GLOVE) ×1 IMPLANT
GLOVE BIOGEL PI IND STRL 8.5 (GLOVE) ×1 IMPLANT
GLOVE BIOGEL PI INDICATOR 8 (GLOVE) ×2
GLOVE BIOGEL PI INDICATOR 8.5 (GLOVE) ×2
GLOVE ECLIPSE 8.0 STRL XLNG CF (GLOVE) ×6 IMPLANT
GLOVE ECLIPSE 8.5 STRL (GLOVE) ×6 IMPLANT
GOWN STRL REUS W/ TWL LRG LVL3 (GOWN DISPOSABLE) ×2 IMPLANT
GOWN STRL REUS W/TWL 2XL LVL3 (GOWN DISPOSABLE) ×3 IMPLANT
GOWN STRL REUS W/TWL LRG LVL3 (GOWN DISPOSABLE) ×4
HANDPIECE INTERPULSE COAX TIP (DISPOSABLE) ×2
KIT BASIN OR (CUSTOM PROCEDURE TRAY) ×3 IMPLANT
KIT ROOM TURNOVER OR (KITS) ×3 IMPLANT
MANIFOLD NEPTUNE II (INSTRUMENTS) ×3 IMPLANT
NEEDLE 22X1 1/2 (OR ONLY) (NEEDLE) ×3 IMPLANT
NS IRRIG 1000ML POUR BTL (IV SOLUTION) ×3 IMPLANT
PACK TOTAL JOINT (CUSTOM PROCEDURE TRAY) ×3 IMPLANT
PAD ARMBOARD 7.5X6 YLW CONV (MISCELLANEOUS) ×6 IMPLANT
PAD CAST 4YDX4 CTTN HI CHSV (CAST SUPPLIES) ×1 IMPLANT
PADDING CAST COTTON 4X4 STRL (CAST SUPPLIES) ×2
PADDING CAST COTTON 6X4 STRL (CAST SUPPLIES) ×3 IMPLANT
SET HNDPC FAN SPRY TIP SCT (DISPOSABLE) ×1 IMPLANT
STAPLER VISISTAT 35W (STAPLE) ×3 IMPLANT
SUCTION FRAZIER HANDLE 10FR (MISCELLANEOUS) ×2
SUCTION TUBE FRAZIER 10FR DISP (MISCELLANEOUS) ×1 IMPLANT
SURGIFLO W/THROMBIN 8M KIT (HEMOSTASIS) IMPLANT
SUT BONE WAX W31G (SUTURE) ×3 IMPLANT
SUT ETHIBOND NAB CT1 #1 30IN (SUTURE) ×6 IMPLANT
SUT MNCRL AB 3-0 PS2 18 (SUTURE) ×3 IMPLANT
SUT VIC AB 0 CT1 27 (SUTURE) ×2
SUT VIC AB 0 CT1 27XBRD ANBCTR (SUTURE) ×1 IMPLANT
SYR CONTROL 10ML LL (SYRINGE) IMPLANT
WRAP KNEE MAXI GEL POST OP (GAUZE/BANDAGES/DRESSINGS) ×3 IMPLANT

## 2017-07-18 NOTE — Progress Notes (Signed)
Orthopedic Tech Progress Note Patient Details:  Kristen MooresBarbara E Leonard December 06, 1940 [id number]  CPM Left Knee CPM Left Knee: On Left Knee Flexion (Degrees): 90 Left Knee Extension (Degrees): 0   Dala Breault 07/18/2017, 10:00 AM

## 2017-07-18 NOTE — Anesthesia Procedure Notes (Signed)
Procedure Name: MAC Date/Time: 07/18/2017 7:20 AM Performed by: Izora Gala, CRNA Pre-anesthesia Checklist: Patient identified, Emergency Drugs available, Suction available and Patient being monitored Patient Re-evaluated:Patient Re-evaluated prior to induction Oxygen Delivery Method: Simple face mask Induction Type: IV induction Placement Confirmation: positive ETCO2

## 2017-07-18 NOTE — Progress Notes (Addendum)
Benefit check submitted for rivaroxaban Carlena Hurl(XARELTO) tablet 10 mg   Lia HoppingGreenlee, Dora  Machele Deihl, RN        # 5 . S/W SONNET @ [employer] [facility] # [phone]    XARELTO 10 MG DAILY   COVER- YES  CO-PAY- $ 40.00 Q/L ONE PILL PER DAYS  TIER- 2 DRUG  PRIOR APPROVAL- NO   [facility]

## 2017-07-18 NOTE — Transfer of Care (Signed)
Immediate Anesthesia Transfer of Care Note  Patient: Marcello MooresBarbara E Hearne  Procedure(s) Performed: LEFT TOTAL KNEE ARTHROPLASTY (Left )  Patient Location: PACU  Anesthesia Type:Spinal  Level of Consciousness: awake and alert   Airway & Oxygen Therapy: Patient Spontanous Breathing and Patient connected to nasal cannula oxygen  Post-op Assessment: Report given to RN and Post -op Vital signs reviewed and stable  Post vital signs: Reviewed and stable  Last Vitals:  Vitals:   07/18/17 0606 07/18/17 0926  BP: (!) 155/79   Pulse: 75   Resp: 18   Temp: 36.9 C (!) 36.4 C  SpO2: 100%     Last Pain:  Vitals:   07/18/17 0606  TempSrc: Oral      Patients Stated Pain Goal: 4 (07/18/17 0603)  Complications: No apparent anesthesia complications

## 2017-07-18 NOTE — Anesthesia Postprocedure Evaluation (Signed)
Anesthesia Post Note  Patient: Marcello MooresBarbara E Mruk  Procedure(s) Performed: LEFT TOTAL KNEE ARTHROPLASTY (Left )     Patient location during evaluation: PACU Anesthesia Type: Spinal Level of consciousness: oriented and awake and alert Pain management: pain level controlled Vital Signs Assessment: post-procedure vital signs reviewed and stable Respiratory status: spontaneous breathing, respiratory function stable and patient connected to nasal cannula oxygen Cardiovascular status: blood pressure returned to baseline and stable Postop Assessment: no headache, no backache and no apparent nausea or vomiting Anesthetic complications: no    Last Vitals:  Vitals:   07/18/17 1015 07/18/17 1059  BP:    Pulse: 64   Resp: 12   Temp:  36.6 C  SpO2: 97%     Last Pain:  Vitals:   07/18/17 1030  TempSrc:   PainSc: 8     LLE Motor Response: Purposeful movement;Responds to commands (07/18/17 1059) LLE Sensation: Full sensation (07/18/17 1059) RLE Motor Response: Purposeful movement;Responds to commands (07/18/17 1059) RLE Sensation: Full sensation (07/18/17 1059) L Sensory Level: S1-Sole of foot, small toes (07/18/17 1059) R Sensory Level: S1-Sole of foot, small toes (07/18/17 1059)  Morissa Obeirne

## 2017-07-18 NOTE — H&P (Signed)
The recent History & Physical has been reviewed. I have personally examined the patient today. There is no interval change to the documented History & Physical. The patient would like to proceed with the procedure.  Valeria Batmaneter W Ladarious Kresse 07/18/2017,  7:07 AM

## 2017-07-18 NOTE — Anesthesia Procedure Notes (Signed)
Anesthesia Regional Block: Adductor canal block   Pre-Anesthetic Checklist: ,, timeout performed, Correct Patient, Correct Site, Correct Laterality, Correct Procedure, Correct Position, site marked, Risks and benefits discussed,  Surgical consent,  Pre-op evaluation,  At surgeon's request and post-op pain management  Laterality: Left  Prep: chloraprep       Needles:  Injection technique: Single-shot  Needle Type: Echogenic Stimulator Needle     Needle Length: 5cm  Needle Gauge: 22     Additional Needles:   Procedures:, nerve stimulator,,, ultrasound used (permanent image in chart),,,,  Narrative:  Start time: 07/18/2017 6:58 AM End time: 07/18/2017 7:08 AM Injection made incrementally with aspirations every 5 mL.  Performed by: Personally  Anesthesiologist: Heather RobertsSinger, James, MD  Additional Notes: Functioning IV was confirmed and monitors were applied.  A 50mm 22ga Arrow echogenic stimulator needle was used. Sterile prep and drape,hand hygiene and sterile gloves were used. Ultrasound guidance: relevant anatomy identified, needle position confirmed, local anesthetic spread visualized around nerve(s)., vascular puncture avoided.  Image printed for medical record. Negative aspiration and negative test dose prior to incremental administration of local anesthetic. The patient tolerated the procedure well.

## 2017-07-18 NOTE — Anesthesia Procedure Notes (Signed)
Spinal  Patient location during procedure: OR Start time: 07/18/2017 7:20 AM End time: 07/18/2017 7:25 AM Staffing Anesthesiologist: Bethena Midgetddono, Remy Voiles, MD Preanesthetic Checklist Completed: patient identified, site marked, surgical consent, pre-op evaluation, timeout performed, IV checked, risks and benefits discussed and monitors and equipment checked Spinal Block Patient position: sitting Prep: DuraPrep Patient monitoring: heart rate, cardiac monitor, continuous pulse ox and blood pressure Approach: midline Location: L3-4 Injection technique: single-shot Needle Needle type: Sprotte  Needle gauge: 24 G Needle length: 9 cm Assessment Sensory level: T4

## 2017-07-18 NOTE — Anesthesia Postprocedure Evaluation (Signed)
Anesthesia Post Note  Patient: Agnieszka E Loring  Procedure(s) Performed: LEFT TOTAL KNEE ARTHROPLASTY (Left )     Patient location during evaluation: PACU Anesthesia Type: Spinal Level of consciousness: oriented and awake and alert Pain management: pain level controlled Vital Signs Assessment: post-procedure vital signs reviewed and stable Respiratory status: spontaneous breathing, respiratory function stable and patient connected to nasal cannula oxygen Cardiovascular status: blood pressure returned to baseline and stable Postop Assessment: no headache, no backache and no apparent nausea or vomiting Anesthetic complications: no    Last Vitals:  Vitals:   07/18/17 1015 07/18/17 1059  BP:    Pulse: 64   Resp: 12   Temp:  36.6 C  SpO2: 97%     Last Pain:  Vitals:   07/18/17 1030  TempSrc:   PainSc: 8     LLE Motor Response: Purposeful movement;Responds to commands (07/18/17 1059) LLE Sensation: Full sensation (07/18/17 1059) RLE Motor Response: Purposeful movement;Responds to commands (07/18/17 1059) RLE Sensation: Full sensation (07/18/17 1059) L Sensory Level: S1-Sole of foot, small toes (07/18/17 1059) R Sensory Level: S1-Sole of foot, small toes (07/18/17 1059)  Bakari Nikolai     

## 2017-07-18 NOTE — Care Management Note (Addendum)
Case Management Note  Patient Details  Name: Kristen MooresBarbara E Leonard MRN: [id number] Date of Birth: 1940-11-16  Subjective/Objective:                 Spoke with patient at the bedside. She states that she has all needed DME at home as she had her other knee done this summer. [facility] and would [facility]. [facility] could not take due to staffing. Patient [facility] No other CM needs identified at this time.    Action/Plan:   Expected Discharge Date:                  Expected Discharge Plan:  Home w Home Health Services  In-House Referral:     Discharge planning Services  CM Consult  Post Acute Care Choice:  Home Health Choice offered to:  Patient  DME Arranged:    DME Agency:     HH Arranged:  PT HH Agency:  [facility]  Status of Service:  In process, will continue to follow  If discussed at Long Length of Stay Meetings, dates discussed:    Additional Comments:  Lawerance SabalDebbie Gertha Lichtenberg, RN 07/18/2017, 3:40 PM

## 2017-07-18 NOTE — Discharge Instructions (Signed)

## 2017-07-18 NOTE — Evaluation (Signed)
Physical Therapy Evaluation Patient Details Name: Kristen MooresBarbara E Leonard MRN: [id number] DOB: 1941/07/31 Today's Date: 07/18/2017   History of Present Illness  Pt is a 76 y/o female s/p elective L TKA. PMH includes OSA, depression, neck surgery, and R TKA.   Clinical Impression  Pt is s/p surgery above with deficits below. PTA, pt was independent with mobility. Upon eval, pt presenting with post op pain and weakness, and slightly decreased balance. Mobility limited to chair this session secondary to pain. Required min to min guard assist with use of RW. Verbal cues required to maintain 50% WB on LLE. Reports daughter will be able to assist at d/c and has all necessary DME. Follow up recommendations per MD arrangements. Will continue to follow acutely to maximize functional mobility independence and safety.     Follow Up Recommendations DC plan and follow up therapy as arranged by surgeon;Supervision for mobility/OOB    Equipment Recommendations  None recommended by PT    Recommendations for Other Services       Precautions / Restrictions Precautions Precautions: Knee Precaution Booklet Issued: Yes (comment) Precaution Comments: Reviewed supine ther ex with pt.  Restrictions Weight Bearing Restrictions: Yes LLE Weight Bearing: Partial weight bearing LLE Partial Weight Bearing Percentage or Pounds: 50      Mobility  Bed Mobility Overal bed mobility: Needs Assistance Bed Mobility: Supine to Sit     Supine to sit: Supervision     General bed mobility comments: Supervision for safety.   Transfers Overall transfer level: Needs assistance Equipment used: Rolling walker (2 wheeled) Transfers: Sit to/from Stand Sit to Stand: Min assist         General transfer comment: Min A for lift assist and steadying assist. Verbal cues for safe hand placement.   Ambulation/Gait Ambulation/Gait assistance: Min guard Ambulation Distance (Feet): 5 Feet Assistive device: Rolling walker (2  wheeled) Gait Pattern/deviations: Step-to pattern;Decreased step length - right;Decreased step length - left;Decreased weight shift to left;Antalgic Gait velocity: Decreased Gait velocity interpretation: Below normal speed for age/gender General Gait Details: Slow, antalgic gait. Verbal cues to press through UEs to maintain 50% WB on LLE. Distance limited to chair secondary to pain. Verbal cues for sequencing using RW.   Stairs            Wheelchair Mobility    Modified Rankin (Stroke Patients Only)       Balance Overall balance assessment: Needs assistance Sitting-balance support: No upper extremity supported;Feet supported Sitting balance-Leahy Scale: Good     Standing balance support: Bilateral upper extremity supported;During functional activity Standing balance-Leahy Scale: Poor Standing balance comment: Reliant on RW for stability                              Pertinent Vitals/Pain Pain Assessment: 0-10 Pain Score: 3  Pain Location: L knee Pain Descriptors / Indicators: Aching;Operative site guarding Pain Intervention(s): Limited activity within patient's tolerance;Monitored during session;Repositioned    Home Living Family/patient expects to be discharged to:: Private residence Living Arrangements: Children Available Help at Discharge: Family;Available 24 hours/day Type of Home: House Home Access: Stairs to enter Entrance Stairs-Rails: None Entrance Stairs-Number of Steps: 1(threshold step ) Home Layout: Two level;Able to live on main level with bedroom/bathroom Home Equipment: Dan HumphreysWalker - 2 wheels;Walker - 4 wheels;Bedside commode;Cane - quad      Prior Function Level of Independence: Independent               Hand  Dominance   Dominant Hand: Right    Extremity/Trunk Assessment   Upper Extremity Assessment Upper Extremity Assessment: Defer to OT evaluation    Lower Extremity Assessment Lower Extremity Assessment: LLE  deficits/detail LLE Deficits / Details: Slightly decreased sensation reported. Deficits consistent with post op pain and weakness. Able to perform ther ex below.     Cervical / Trunk Assessment Cervical / Trunk Assessment: Normal  Communication   Communication: No difficulties  Cognition Arousal/Alertness: Awake/alert Behavior During Therapy: WFL for tasks assessed/performed Overall Cognitive Status: Within Functional Limits for tasks assessed                                        General Comments      Exercises Total Joint Exercises Ankle Circles/Pumps: AROM;Both;20 reps Quad Sets: AROM;Left;10 reps Towel Squeeze: AROM;Both;10 reps Short Arc Quad: AROM;Left;10 reps Heel Slides: AROM;Left;10 reps Hip ABduction/ADduction: AROM;Left;10 reps   Assessment/Plan    PT Assessment Patient needs continued PT services  PT Problem List Decreased strength;Decreased range of motion;Decreased balance;Decreased activity tolerance;Decreased mobility;Decreased knowledge of use of DME;Decreased knowledge of precautions;Pain       PT Treatment Interventions DME instruction;Gait training;Stair training;Functional mobility training;Therapeutic activities;Therapeutic exercise;Balance training;Neuromuscular re-education;Patient/family education    PT Goals (Current goals can be found in the Care Plan section)  Acute Rehab PT Goals Patient Stated Goal: to go home  PT Goal Formulation: With patient Time For Goal Achievement: 07/25/17 Potential to Achieve Goals: Good    Frequency 7X/week   Barriers to discharge        Co-evaluation               AM-PAC PT "6 Clicks" Daily Activity  Outcome Measure Difficulty turning over in bed (including adjusting bedclothes, sheets and blankets)?: A Little Difficulty moving from lying on back to sitting on the side of the bed? : A Little Difficulty sitting down on and standing up from a chair with arms (e.g., wheelchair, bedside  commode, etc,.)?: Unable Help needed moving to and from a bed to chair (including a wheelchair)?: A Little Help needed walking in hospital room?: A Little Help needed climbing 3-5 steps with a railing? : A Lot 6 Click Score: 15    End of Session Equipment Utilized During Treatment: Gait belt Activity Tolerance: Patient limited by pain Patient left: in chair;with call bell/phone within reach Nurse Communication: Mobility status PT Visit Diagnosis: Other abnormalities of gait and mobility (R26.89);Pain Pain - Right/Left: Left Pain - part of body: Knee    Time: [id number] PT Time Calculation (min) (ACUTE ONLY): 20 min   Charges:   PT Evaluation $PT Eval Low Complexity: 1 Low     PT G Codes:        Gladys DammeBrittany Saniya Tranchina, PT, DPT  Acute Rehabilitation Services  Pager: [phone]   Lehman PromBrittany S Brihana Quickel 07/18/2017, 2:19 PM

## 2017-07-18 NOTE — Op Note (Signed)
PATIENT ID:      Kristen MooresBarbara E Leonard  MRN:     [id number] DOB/AGE:    October 14, 1940 / 76 y.o.       OPERATIVE REPORT    DATE OF PROCEDURE:  07/18/2017       PREOPERATIVE DIAGNOSIS: End Stage  Osteoarthritis Left Knee                                                       Estimated body mass index is 29.15 kg/m as calculated from the following:   Height as of 07/12/17: 5\' 5"  (1.651 m).   Weight as of 07/12/17: 175 lb 3.2 oz (79.5 kg).     POSTOPERATIVE DIAGNOSIS: End Stage  Osteoarthritis Left Knee                                                                     Estimated body mass index is 29.15 kg/m as calculated from the following:   Height as of 07/12/17: 5\' 5"  (1.651 m).   Weight as of 07/12/17: 175 lb 3.2 oz (79.5 kg).     PROCEDURE:  Procedure(s): LEFT TOTAL KNEE ARTHROPLASTY      SURGEON:  Norlene CampbellPeter Larone Kliethermes, MD    ASSISTANT:   Jacqualine CodeBrian Petrarca, PA-C   (Present and scrubbed throughout the case, critical for assistance with exposure, retraction, instrumentation, and closure.)          ANESTHESIA: regional, spinal and IV sedation     DRAINS: none :      TOURNIQUET TIME:  Total Tourniquet Time Documented: area (laterality) - 61 minutes Total: area (laterality) - 61 minutes     COMPLICATIONS:  None   CONDITION:  stable  PROCEDURE IN DETAIL: [id number]  Claude MangesPeter W Arleth Mccullar 07/18/2017, 9:02 AM

## 2017-07-19 ENCOUNTER — Encounter (HOSPITAL_COMMUNITY): Payer: Self-pay | Admitting: Orthopaedic Surgery

## 2017-07-19 DIAGNOSIS — M1712 Unilateral primary osteoarthritis, left knee: Secondary | ICD-10-CM | POA: Diagnosis not present

## 2017-07-19 LAB — URINE CULTURE: CULTURE: NO GROWTH

## 2017-07-19 LAB — CBC
HEMATOCRIT: 31.9 % — AB (ref 36.0–46.0)
HEMOGLOBIN: 10.4 g/dL — AB (ref 12.0–15.0)
MCH: 29.1 pg (ref 26.0–34.0)
MCHC: 32.6 g/dL (ref 30.0–36.0)
MCV: 89.4 fL (ref 78.0–100.0)
Platelets: 208 10*3/uL (ref 150–400)
RBC: 3.57 MIL/uL — AB (ref 3.87–5.11)
RDW: 14.5 % (ref 11.5–15.5)
WBC: 9.6 10*3/uL (ref 4.0–10.5)

## 2017-07-19 LAB — BASIC METABOLIC PANEL
ANION GAP: 7 (ref 5–15)
BUN: 17 mg/dL (ref 6–20)
CHLORIDE: 103 mmol/L (ref 101–111)
CO2: 25 mmol/L (ref 22–32)
Calcium: 8.9 mg/dL (ref 8.9–10.3)
Creatinine, Ser: 0.85 mg/dL (ref 0.44–1.00)
GFR calc Af Amer: >60 mL/min (ref >60)
GLUCOSE: 118 mg/dL — AB (ref 65–99)
POTASSIUM: 4.1 mmol/L (ref 3.5–5.1)
Sodium: 135 mmol/L (ref 135–145)

## 2017-07-19 MED ORDER — METHOCARBAMOL 500 MG PO TABS: 500.0000 mg | ORAL_TABLET | Freq: Three times a day (TID) | ORAL | 0 refills | Status: DC | PRN

## 2017-07-19 MED ORDER — RIVAROXABAN 10 MG PO TABS: 10.0000 mg | ORAL_TABLET | Freq: Every day | ORAL | 0 refills | Status: DC

## 2017-07-19 MED ORDER — OXYCODONE HCL 5 MG PO TABS: 5.0000 mg | ORAL_TABLET | ORAL | 0 refills | Status: DC | PRN

## 2017-07-19 NOTE — Discharge Planning (Signed)
Pt given prescriptions and discharge instructions. Instructions gone over with her and answered any questions to satisfaction. [facility] Awaiting daughter to arrive and she will drive her home. Pt in no distress at time of discharge.

## 2017-07-19 NOTE — Discharge Summary (Signed)
[name], MD   [name], PA-C [address], [place], [place]  [address]                             [phone]  PATIENT ID: [name]        MRN:  [id number]          DOB/AGE: July 24, 1941 / 76 y.o.    DISCHARGE SUMMARY  ADMISSION DATE:    07/18/2017 DISCHARGE DATE:   07/19/2017   ADMISSION DIAGNOSIS: Osteoarthritis Left Knee    DISCHARGE DIAGNOSIS:  Osteoarthritis Left Knee    ADDITIONAL DIAGNOSIS: Active Problems:   S/P total knee replacement using cement, left  Past Medical History:  Diagnosis Date  . Acid reflux disease   . Arthritis   . Atypical chest pain   . Back pain   . Depression   . Gastroparesis   . H/O: hysterectomy   . Hiatal hernia   . Hypothyroidism   . Neck pain   . Self inflicted injury   . Sleep apnea     PROCEDURE: Procedure(s): LEFT TOTAL KNEE ARTHROPLASTY  on 07/18/2017  CONSULTS: none    HISTORY: [name], 76 y.o. female, has a history of pain and functional disability in the left knee due to arthritis and has failed non-surgical conservative treatments for greater than 12 weeks to includeNSAID's and/or analgesics, corticosteriod injections, viscosupplementation injections, flexibility and strengthening excercises, supervised PT with diminished ADL's post treatment, weight reduction as appropriate and activity modification.  Onset of symptoms was abrupt, starting 2 years ago with rapidlly worsening course since that time. The patient noted no past surgery on the left knee(s).  Patient currently rates pain in the left knee(s) at 8 out of 10 with activity. Patient has night pain, worsening of pain with activity and weight bearing, pain that interferes with activities of daily living, pain with passive range of motion, crepitus and joint swelling.  Patient has evidence of subchondral cysts, subchondral sclerosis, periarticular osteophytes and joint space narrowing by imaging studies. This patient has had proximal tibial  fracture and no surgeries. There is no active infection.     HOSPITAL COURSE:  [name] is a 76 y.o. admitted on 07/18/2017 and found to have a diagnosis of Osteoarthritis Left Knee.  After appropriate laboratory studies were obtained  they were taken to the operating room on 07/18/2017 and underwent  Procedure(s): LEFT TOTAL KNEE ARTHROPLASTY  .   They were given perioperative antibiotics:  Anti-infectives (From admission, onward)   Start     Dose/Rate Route Frequency Ordered Stop   07/18/17 2000  vancomycin (VANCOCIN) IVPB 1000 mg/200 mL premix     1,000 mg 200 mL/hr over 60 Minutes Intravenous Every 12 hours 07/18/17 1106 07/18/17 2111   07/18/17 0630  vancomycin (VANCOCIN) IVPB 1000 mg/200 mL premix     1,000 mg 200 mL/hr over 60 Minutes Intravenous To ShortStay Surgical 07/17/17 1026 07/18/17 0730    .  Tolerated the procedure well.  Placed with a foley intraoperatively.    Toradol was given post op.  POD #1, allowed out of bed to a chair.  PT for ambulation and exercise program.  Foley D/C'd in morning.  IV saline locked.  O2 discontionued.  Desired discharge to home.  The remainder of the hospital course was dedicated to ambulation and strengthening.   The patient was discharged on 1 Day Post-Op in  Stable condition.  Blood  products given:none  DIAGNOSTIC STUDIES: Recent vital signs:  Patient Vitals for the past 24 hrs:  BP Temp Temp src Pulse Resp SpO2  07/19/17 0500 128/67 98.2 F (36.8 C) Oral 69 16 97 %  07/19/17 0030 108/66 98.4 F (36.9 C) Oral 78 18 98 %  07/18/17 2111 120/64 98.3 F (36.8 C) Oral 83 - 97 %  07/18/17 1712 110/63 98.6 F (37 C) Oral 96 - 96 %  07/18/17 1121 (!) 153/88 97.9 F (36.6 C) Oral 73 14 99 %  07/18/17 1059 - 97.8 F (36.6 C) - - - -  07/18/17 1056 - - - 68 13 96 %  07/18/17 1050 - - - 68 12 94 %  07/18/17 1044 137/77 - - 65 11 95 %  07/18/17 1040 - - - 66 13 95 %  07/18/17 1030 - - - 65 12 98 %  07/18/17 1029 133/75 - - 65  11 97 %  07/18/17 1020 - - - 64 12 95 %  07/18/17 1015 - - - 64 12 97 %  07/18/17 1014 132/72 - - 65 11 96 %  07/18/17 1010 - - - 62 12 95 %  07/18/17 1000 - - - 67 17 96 %  07/18/17 0959 128/70 - - 65 12 97 %  07/18/17 0950 - - - 66 11 98 %  07/18/17 0945 - - - 66 15 96 %  07/18/17 0944 127/72 - - 66 13 97 %  07/18/17 0940 - - - 69 14 97 %  07/18/17 0930 - - - 74 13 97 %  07/18/17 0929 124/67 - - - - -  07/18/17 0926 124/67 (!) 97.5 F (36.4 C) - 73 16 98 %       Recent laboratory studies: Recent Labs    07/12/17 0824 07/19/17 0635  WBC 4.3 9.6  HGB 12.9 10.4*  HCT 39.0 31.9*  PLT 221 208   Recent Labs    07/12/17 0824  NA 140  K 3.8  CL 106  CO2 27  BUN 14  CREATININE 0.78  GLUCOSE 124*  CALCIUM 9.5   Lab Results  Component Value Date   INR 0.98 07/12/2017   INR 1.04 02/14/2017     Recent Radiographic Studies :  No results found.  DISCHARGE INSTRUCTIONS: Discharge Instructions    CPM   Complete by:  As directed    Continuous passive motion machine (CPM):      Use the CPM from 0 to 60 degrees for 6-8 hours per day.      You may increase by 5-10 per day.  You may break it up into 2 or 3 sessions per day.      Use CPM for 3-4 weeks or until you are told to stop.   Call MD / Call 911   Complete by:  As directed    If you experience chest pain or shortness of breath, CALL 911 and be transported to the hospital emergency room.  If you develope a fever above 101 F, pus (white drainage) or increased drainage or redness at the wound, or calf pain, call your surgeon's office.   Change dressing   Complete by:  As directed    DO NOT CHANGE YOUR DRESSING.   Constipation Prevention   Complete by:  As directed    Drink plenty of fluids.  Prune juice may be helpful.  You may use a stool softener, such as Colace (over the counter) 100 mg twice a  day.  Use MiraLax (over the counter) for constipation as needed.   Diet general   Complete by:  As directed    Discharge  instructions   Complete by:  As directed    INSTRUCTIONS AFTER JOINT REPLACEMENT   Remove items at home which could result in a fall. This includes throw rugs or furniture in walking pathways ICE to the affected joint every three hours while awake for 30 minutes at a time, for at least the first 3-5 days, and then as needed for pain and swelling.  Continue to use ice for pain and swelling. You may notice swelling that will progress down to the foot and ankle.  This is normal after surgery.  Elevate your leg when you are not up walking on it.   Continue to use the breathing machine you got in the hospital (incentive spirometer) which will help keep your temperature down.  It is common for your temperature to cycle up and down following surgery, especially at night when you are not up moving around and exerting yourself.  The breathing machine keeps your lungs expanded and your temperature down.   DIET:  As you were doing prior to hospitalization, we recommend a well-balanced diet.  DRESSING / WOUND CARE / SHOWERING  Keep the surgical dressing until follow up.  The dressing is water proof, so you can shower without any extra covering.  IF THE DRESSING FALLS OFF or the wound gets wet inside, change the dressing with sterile gauze.  Please use good hand washing techniques before changing the dressing.  Do not use any lotions or creams on the incision until instructed by your surgeon.    ACTIVITY  Increase activity slowly as tolerated, but follow the weight bearing instructions below.   No driving for 6 weeks or until further direction given by your physician.  You cannot drive while taking narcotics.  No lifting or carrying greater than 10 lbs. until further directed by your surgeon. Avoid periods of inactivity such as sitting longer than an hour when not asleep. This helps prevent blood clots.  You may return to work once you are authorized by your doctor.     WEIGHT BEARING   Partial weight  bearing with assist device as directed.  50%   EXERCISES  Results after joint replacement surgery are often greatly improved when you follow the exercise, range of motion and muscle strengthening exercises prescribed by your doctor. Safety measures are also important to protect the joint from further injury. Any time any of these exercises cause you to have increased pain or swelling, decrease what you are doing until you are comfortable again and then slowly increase them. If you have problems or questions, call your caregiver or physical therapist for advice.   Rehabilitation is important following a joint replacement. After just a few days of immobilization, the muscles of the leg can become weakened and shrink (atrophy).  These exercises are designed to build up the tone and strength of the thigh and leg muscles and to improve motion. Often times heat used for twenty to thirty minutes before working out will loosen up your tissues and help with improving the range of motion but do not use heat for the first two weeks following surgery (sometimes heat can increase post-operative swelling).   These exercises can be done on a training (exercise) mat,  on a table or on a bed. Use whatever works the best and is most comfortable for you.  Use music or television while you are exercising so that the exercises are a pleasant break in your day. This will make your life better with the exercises acting as a break in your routine that you can look forward to.   Perform all exercises about fifteen times, three times per day or as directed.  You should exercise both the operative leg and the other leg as well.   Exercises include:  Quad Sets - Tighten up the muscle on the front of the thigh (Quad) and hold for 5-10 seconds.   Straight Leg Raises - With your knee straight (if you were given a brace, keep it on), lift the leg to 60 degrees, hold for 3 seconds, and slowly lower the leg.  Perform this exercise  against resistance later as your leg gets stronger.  Leg Slides: Lying on your back, slowly slide your foot toward your buttocks, bending your knee up off the floor (only go as far as is comfortable). Then slowly slide your foot back down until your leg is flat on the floor again.  Angel Wings: Lying on your back spread your legs to the side as far apart as you can without causing discomfort.  Hamstring Strength:  Lying on your back, push your heel against the floor with your leg straight by tightening up the muscles of your buttocks.  Repeat, but this time bend your knee to a comfortable angle, and push your heel against the floor.  You may put a pillow under the heel to make it more comfortable if necessary.   A rehabilitation program following joint replacement surgery can speed recovery and prevent re-injury in the future due to weakened muscles. Contact your doctor or a physical therapist for more information on knee rehabilitation.    CONSTIPATION  Constipation is defined medically as fewer than three stools per week and severe constipation as less than one stool per week.  Even if you have a regular bowel pattern at home, your normal regimen is likely to be disrupted due to multiple reasons following surgery.  Combination of anesthesia, postoperative narcotics, change in appetite and fluid intake all can affect your bowels.   YOU MUST use at least one of the following options; they are listed in order of increasing strength to get the job done.  They are all available over the counter, and you may need to use some, POSSIBLY even all of these options:    Drink plenty of fluids (prune juice may be helpful) and high fiber foods Colace 100 mg by mouth twice a day  Senokot for constipation as directed and as needed Dulcolax (bisacodyl), take with full glass of water  Miralax (polyethylene glycol) once or twice a day as needed.  If you have tried all these things and are unable to have a bowel  movement in the first 3-4 days after surgery call either your surgeon or your primary doctor.    If you experience loose stools or diarrhea, hold the medications until you stool forms back up.  If your symptoms do not get better within 1 week or if they get worse, check with your doctor.  If you experience "the worst abdominal pain ever" or develop nausea or vomiting, please contact the office immediately for further recommendations for treatment.   ITCHING:  If you experience itching with your medications, try taking only a single pain pill, or even half a pain pill at a time.  You can also use Benadryl over the counter  for itching or also to help with sleep.   TED HOSE STOCKINGS:  Use stockings on both legs until for at least 2 weeks or as directed by physician office. They may be removed at night for sleeping.  MEDICATIONS:  See your medication summary on the "After Visit Summary" that nursing will review with you.  You may have some home medications which will be placed on hold until you complete the course of blood thinner medication.  It is important for you to complete the blood thinner medication as prescribed.  PRECAUTIONS:  If you experience chest pain or shortness of breath - call 911 immediately for transfer to the hospital emergency department.   If you develop a fever greater that 101 F, purulent drainage from wound, increased redness or drainage from wound, foul odor from the wound/dressing, or calf pain - CONTACT YOUR SURGEON.                                                   FOLLOW-UP APPOINTMENTS:  If you do not already have a post-op appointment, please call the office for an appointment to be seen by your surgeon.  Guidelines for how soon to be seen are listed in your "After Visit Summary", but are typically between 1-4 weeks after surgery.  OTHER INSTRUCTIONS:   Knee Replacement:  Do not place pillow under knee, focus on keeping the knee straight while resting. CPM  instructions: 0-90 degrees, 2 hours in the morning, 2 hours in the afternoon, and 2 hours in the evening. Place foam block, curve side up under heel at all times except when in CPM or when walking.  DO NOT modify, tear, cut, or change the foam block in any way.  MAKE SURE YOU:  Understand these instructions.  Get help right away if you are not doing well or get worse.    Thank you for letting us be a part of your medical care team.  It is a privilege we respect greatly.  We hope these instructions will help you stay on track for a fast and full recovery!   Do not put a pillow under the knee. Place it under the heel.   Complete by:  As directed    Driving restrictions   Complete by:  As directed    No driving for 6 weeks   Increase activity slowly as tolerated   Complete by:  As directed    Lifting restrictions   Complete by:  As directed    No lifting for 6 weeks   Partial weight bearing   Complete by:  As directed    % Body Weight:  50%   Laterality:  left   Extremity:  Lower   Patient may shower   Complete by:  As directed    You may shower over your dressing   TED hose   Complete by:  As directed    Use stockings (TED hose) for 3 weeks on left  leg.  You may remove them at night for sleeping.      DISCHARGE MEDICATIONS:   Allergies as of 07/19/2017      Reactions   Penicillins Hives   Has patient had a PCN reaction causing immediate rash, facial/tongue/throat swelling, SOB or lightheadedness with hypotension: Unknown Has patient had a PCN reaction causing severe rash involving mucus membranes  or skin necrosis: Unknown Has patient had a PCN reaction that required hospitalization: No Has patient had a PCN reaction occurring within the last 10 years: No If all of the above answers are "NO", then may proceed with Cephalosporin use.      Medication List    STOP taking these medications   BIOFREEZE EX   glucosamine-chondroitin 500-400 MG tablet   ibuprofen 200 MG  tablet Commonly known as:  ADVIL,MOTRIN     TAKE these medications   albuterol 108 (90 Base) MCG/ACT inhaler Commonly known as:  PROVENTIL HFA;VENTOLIN HFA Inhale 2 puffs every 6 (six) hours as needed into the lungs for wheezing or shortness of breath.   levothyroxine 75 MCG tablet Commonly known as:  SYNTHROID, LEVOTHROID Take 75 mcg by mouth daily before breakfast.   methocarbamol 500 MG tablet Commonly known as:  ROBAXIN Take 1 tablet (500 mg total) by mouth every 8 (eight) hours as needed for muscle spasms.   multivitamin with minerals tablet Take 1 tablet by mouth daily.   oxyCODONE 5 MG immediate release tablet Commonly known as:  Oxy IR/ROXICODONE Take 1-2 tablets (5-10 mg total) by mouth every 4 (four) hours as needed for moderate pain or severe pain. What changed:    how much to take  when to take this  reasons to take this   rivaroxaban 10 MG Tabs tablet Commonly known as:  XARELTO Take 1 tablet (10 mg total) by mouth at bedtime. What changed:  when to take this            Durable Medical Equipment  (From admission, onward)        Start     Ordered   07/18/17 1107  DME Walker rolling  Once    Question:  Patient needs a walker to treat with the following condition  Answer:  S/P total knee replacement using cement, right   07/18/17 1106   07/18/17 1107  DME 3 n 1  Once     07/18/17 1106   07/18/17 1107  DME Bedside commode  Once    Question:  Patient needs a bedside commode to treat with the following condition  Answer:  S/P total knee replacement using cement, right   07/18/17 1106       Discharge Care Instructions  (From admission, onward)        Start     Ordered   07/19/17 0000  Partial weight bearing    Question Answer Comment  % Body Weight 50%   Laterality left   Extremity Lower      07/19/17 0815   07/19/17 0000  Change dressing    Comments:  DO NOT CHANGE YOUR DRESSING.   07/19/17 0815      FOLLOW UP VISIT:   Follow-up  Information    [facility] Follow up.   Specialty:  Home Health Services Contact information: [address] [place] [address] [phone]           DISPOSITION:   Home  CONDITION:  Stable   Oris Drone. Aleda Grana [facility] [phone]  07/19/2017 8:16 AM

## 2017-07-19 NOTE — Care Management CC44 (Signed)
Condition Code 44 Documentation Completed  Patient Details  Name: [name] MRN: [id number] Date of Birth: May 22, 1941   Condition Code 44 given:   Yes Patient signature on Condition Code 44 notice:   Yes Documentation of 2 MD's agreement:   Yes Code 44 added to claim:   Yes    Glennon Macmerson, Mavis Fichera M, RN 07/19/2017, 10:15 AM

## 2017-07-19 NOTE — Care Management CC44 (Deleted)
Condition Code 44 Documentation Completed  Patient Details  Name: Kristen MooresBarbara E Leonard MRN: [id number] Date of Birth: 1941/08/28   Condition Code 44 given:    Patient signature on Condition Code 44 notice:    Documentation of 2 MD's agreement:    Code 44 added to claim:       Glennon MacAmerson, Yassmine Tamm M, RN 07/19/2017, 10:14 AM

## 2017-07-19 NOTE — Progress Notes (Signed)
   07/18/17 1400  PT G-Codes **NOT FOR INPATIENT CLASS**  Functional Assessment Tool Used AM-PAC 6 Clicks Basic Mobility;Clinical judgement  Functional Limitation Mobility: Walking and moving around  Mobility: Walking and Moving Around Current Status [phone](G8978) CK  Mobility: Walking and Moving Around Goal Status [phone](G8979) CI   Inserting G codes  Gladys DammeBrittany Marika Mahaffy, PT, DPT  Acute Rehabilitation Services  Pager: [phone]

## 2017-07-19 NOTE — Plan of Care (Signed)
  Clinical Measurements: Postoperative complications will be avoided or minimized 07/19/2017 1034 - Completed/Met by Governor Rooks, RN   Skin Integrity: Risk for impaired skin integrity will decrease 07/19/2017 1034 - Completed/Met by Governor Rooks, RN   Safety: Ability to remain free from injury will improve 07/19/2017 1034 - Completed/Met by Governor Rooks, RN   Pain Management: Pain level will decrease with appropriate interventions 07/19/2017 1034 - Adequate for Discharge by Governor Rooks, RN

## 2017-07-19 NOTE — Op Note (Signed)
NAME:  [name], [name]                     ACCOUNT NO.:  MEDICAL RECORD NO.:  [id number]  LOCATION:                                 FACILITY:  PHYSICIAN:  Vonna Kotyk. Tequisha Maahs, M.D.DATE OF BIRTH:  Mar 20, 1941  DATE OF PROCEDURE:  07/18/2017 DATE OF DISCHARGE:                              OPERATIVE REPORT   PREOPERATIVE DIAGNOSIS:  End-stage osteoarthritis, left knee.  POSTOPERATIVE DIAGNOSIS:  End-stage osteoarthritis, left knee.  PROCEDURE:  Left total knee replacement.  SURGEON:  Vonna Kotyk. Durward Fortes, MD.  ASSISTANT:  Biagio Borg, PAC.  ANESTHESIA:  Spinal with supplemental adductor canal block and IV sedation.  COMPLICATIONS:  None.  COMPONENTS:  DePuy LCS standard femoral component, a size 3 rotating tibial tray, 10-mm polyethylene bridging bearing, metal-backed 3 peg rotating patella.  Components were secured with polymethyl methacrylate.  DESCRIPTION OF PROCEDURE:  Ms. Shipman was met in the holding area and identified the right left knee as appropriate operative site and marked it accordingly.  Anesthesia performed an adductor canal block.  Mrs. Constantine was then transported to room #7.  Anesthesia performed spinal anesthesia without difficulty.  The patient was then placed in the supine position.  Nursing staff inserted a Foley catheter.  Urine was clear.  Tourniquet was then applied to the left thigh.  The left lower extremity was prepped with chlorhexidine scrub and DuraPrep x2 from the tourniquet to the tips of the toes.  Sterile draping was performed. Time-out was called.  The left lower extremity was then elevated and Esmarch exsanguinated with a proximal tourniquet at 350 mmHg.  A midline longitudinal incision was made centered about the patella extending from the superior pouch to tibial tubercle.  Via sharp dissection, incision was carried down to the subcutaneous tissue.  Gross bleeders were Bovie coagulated.  First layer of capsule was incised in midline.  A  medial parapatellar incision was then made through the deep capsule with the Bovie.  The joint was then entered.  There was a clear yellow joint effusion.  The patella was easily everted 180 degrees.  The knee flexed to 90 degrees.  There was abundant beefy red synovitis. Synovectomy was performed.  There were moderate-sized osteophytes along the medial and lateral femoral condyle and medial tibial plateau.  These were removed with a rongeur.  I measured a standard femoral component.  First, a bony cut was then made transversely in the proximal tibia with a 7-degree angle of declination.  After each bony cut on the tibia and the femur, I checked the alignment with the external alignment guide.  Subsequent cuts were then made on the femur using the standard femoral jig.  Flexion and extension gaps were perfectly symmetrical at 10 mm.  Laminar spreaders were then inserted along the medial lateral compartment.  I removed medial lateral menisci as well as ACL and PCL. There were no loose bodies.  A 3/4-inch curved osteotome was used to remove any osteophytes in the posterior femoral condyle.  Again, I checked flexion gap.  A 4-degree distal femoral valgus cut was then made on the femur.  The finishing guide was then applied for tapering purposes in the center  hole.  Retractors were then placed about the tibia, was advanced anteriorly and measured #3 tibial tray.  This was pinned in place.  The center hole was made followed by the keeled cut.  With the trial tibial jig in place, a 10-mm polyethylene bridging bearing was applied followed by the trial standard femoral component.  The components reduced and through a full range of motion, there were full extension and flexion and no malrotation of the components.  MCL and LCL remained intact throughout the procedure.  The patella was applied, removing 10 mm of bone, leaving 13 mm of patella thickness.  The patella jig was applied, 3 holes  made, and the trial patella inserted and reduced.  Through a full range of motion, it remained perfectly stable.  Trial components were then removed.  The joint was copiously irrigated with saline solution.  The final components were then impacted with polymethyl methacrylate. We initially applied the tibial component followed by the 10-mm polyethylene bridging bearing and then the standard femoral component. The knee was placed in extension with excellent compaction of the components.  Any extraneous methacrylate was removed from the periphery of the components.  Patella was applied with methacrylate and a patellar clamp.  At approximately 16 minutes, the methacrylate had matured during which time we injected the joint with Exparel.  We irrigated the joint again.  The tourniquet was deflated at 61 minutes.  We applied topical tranexamic acid under compression for approximately 5-6 minutes.  At that point, there was very minimal bleeding.  The joint appeared to be perfectly dry.  Hemovac was not necessary.  The deep capsule was then closed with a running #1 Ethibond, superficial capsule with a running 0 Vicryl, subcu with several layers of Vicryl and 3-0 Monocryl.  The skin was closed with skin clips.  A sterile bulky dressing was applied followed by the patient's support stocking.  The patient tolerated the procedure without complications.     Vonna Kotyk. Durward Fortes, M.D.     PWW/MEDQ  D:  07/18/2017  T:  07/18/2017  Job:  [id number]

## 2017-07-19 NOTE — Care Management Note (Signed)
Case Management Note  Original note by: Lawerance Sabalebbie Swist, RN 07/18/2017, 3:40 PM     Patient Details  Name: [name] MRN: [id number] Date of Birth: 12-26-1940  Subjective/Objective:                 Spoke with patient at the bedside. She states that she has all needed DME at home as she had her other knee done this summer. [facility] and would like to use them [facility]. [facility] due to staffing. Patient [facility] No other CM needs identified at this time.    Action/Plan:   Expected Discharge Date:  07/19/17               Expected Discharge Plan:  Home w Home Health Services  In-House Referral:     Discharge planning Services  CM Consult  Post Acute Care Choice:  Home Health Choice offered to:  Patient  DME Arranged:    DME Agency:     HH Arranged:  PT HH Agency:  [facility]  Status of Service:  In process, will continue to follow  If discussed at Long Length of Stay Meetings, dates discussed:    Additional Comments:  07/19/17 J. Yidel Teuscher, RN, BSN Pt medically stable for dc home today.  Harrold Donathathan with [employer] visited pt to arrange delivery of CPM to pt's home post-discharge.  Pt has all other needed DME at home.  [facility] to provide HHPT; will notify of dc home today.    Glennon MacAmerson, Kiira Brach M, RN 07/19/2017, 10:20 AM

## 2017-07-19 NOTE — Progress Notes (Signed)
PATIENT ID: [name]        MRN:  [id number]          DOB/AGE: [birth year] / 76 y.o.    Kristen CampbellPeter Marylu Dudenhoeffer, MD   Kristen CodeBrian Petrarca, PA-C [address] [place], [place]  [id number]                             [phone]   PROGRESS NOTE  Subjective:  negative for Chest Pain  negative for Shortness of Breath  negative for Nausea/Vomiting   negative for Calf Pain    Tolerating Diet: yes         Patient reports pain as mild.     Minimal discomfort  Objective: Vital signs in last 24 hours:    Patient Vitals for the past 24 hrs:  BP Temp Temp src Pulse Resp SpO2  07/19/17 0500 128/67 98.2 F (36.8 C) Oral 69 16 97 %  07/19/17 0030 108/66 98.4 F (36.9 C) Oral 78 18 98 %  07/18/17 2111 120/64 98.3 F (36.8 C) Oral 83 - 97 %  07/18/17 1712 110/63 98.6 F (37 C) Oral 96 - 96 %  07/18/17 1121 (!) 153/88 97.9 F (36.6 C) Oral 73 14 99 %  07/18/17 1059 - 97.8 F (36.6 C) - - - -  07/18/17 1056 - - - 68 13 96 %  07/18/17 1050 - - - 68 12 94 %  07/18/17 1044 137/77 - - 65 11 95 %  07/18/17 1040 - - - 66 13 95 %  07/18/17 1030 - - - 65 12 98 %  07/18/17 1029 133/75 - - 65 11 97 %  07/18/17 1020 - - - 64 12 95 %  07/18/17 1015 - - - 64 12 97 %  07/18/17 1014 132/72 - - 65 11 96 %  07/18/17 1010 - - - 62 12 95 %  07/18/17 1000 - - - 67 17 96 %  07/18/17 0959 128/70 - - 65 12 97 %  07/18/17 0950 - - - 66 11 98 %  07/18/17 0945 - - - 66 15 96 %  07/18/17 0944 127/72 - - 66 13 97 %  07/18/17 0940 - - - 69 14 97 %  07/18/17 0930 - - - 74 13 97 %  07/18/17 0929 124/67 - - - - -  07/18/17 0926 124/67 (!) 97.5 F (36.4 C) - 73 16 98 %      Intake/Output from previous day:   11/20 0701 - 11/21 0700 In: 1625 [I.V.:1425] Out: 3625 [Urine:3600]   Intake/Output this shift:   No intake/output data recorded.   Intake/Output      11/20 0701 - 11/21 0700 11/21 0701 - 11/22 0700   I.V. 1425    Other 100    IV Piggyback 100    Total Intake 1625    Urine 3600    Blood 25    Total Output 3625    Net -2000            LABORATORY DATA: Recent Labs    07/12/17 0824 07/19/17 0635  WBC 4.3 9.6  HGB 12.9 10.4*  HCT 39.0 31.9*  PLT 221 208   Recent Labs    07/12/17 0824  NA 140  K 3.8  CL 106  CO2 27  BUN 14  CREATININE 0.78  GLUCOSE 124*  CALCIUM 9.5   Lab Results  Component Value Date  INR 0.98 07/12/2017   INR 1.04 02/14/2017    Recent Radiographic Studies :  No results found.   Examination:  General appearance: alert, cooperative and no distress  Wound Exam: clean, dry, intact   Drainage:  None: wound tissue dry  Motor Exam: EHL, FHL, Anterior Tibial and Posterior Tibial Intact  Sensory Exam: Superficial Peroneal, Deep Peroneal and Tibial normal  Vascular Exam: Normal  Assessment:    1 Day Post-Op  Procedure(s) (LRB): LEFT TOTAL KNEE ARTHROPLASTY (Left)  ADDITIONAL DIAGNOSIS:  Active Problems:   S/P total knee replacement using cement, left  no new problems   Plan: Physical Therapy as ordered Partial Weight Bearing @ 50% (PWB)  DVT Prophylaxis:  Xarelto and TED hose  DISCHARGE PLAN: Home  DISCHARGE NEEDS: HHPT Will discharge today-stable and no problems, voiding without difficulty. Dressing left knee changed to aquacell.       Valeria Batmaneter W Camy Leder  07/19/2017 8:18 AM  Patient ID: Kristen MooresBarbara E Schiltz, female   DOB: February 18, 1941, 76 y.o.   MRN: [id number]

## 2017-07-19 NOTE — Care Management Obs Status (Signed)
MEDICARE OBSERVATION STATUS NOTIFICATION   Patient Details  Name: [name] MRN: [id number] Date of Birth: [birth year]   Medicare Observation Status Notification Given:  Yes    Glennon Macmerson, Holland Kotter M, RN 07/19/2017, 10:13 AM

## 2017-07-19 NOTE — Evaluation (Signed)
Occupational Therapy Evaluation Patient Details Name: [name] MRN: [id number] DOB: February 04, 1941 Today's Date: 07/19/2017    History of Present Illness Pt is a 76 y/o female s/p elective L TKA. PMH includes OSA, depression, neck surgery, and R TKA.    Clinical Impression   Pt is min A - min guard A with LB ADLs and sup with ADL mobility. Pt will have assist from family and has all necessary DME and A/E at home from previous knee surgery. All education completed and no further acute OT is indicated at this time    Follow Up Recommendations  No OT follow up;Supervision - Intermittent    Equipment Recommendations  None recommended by OT    Recommendations for Other Services       Precautions / Restrictions Precautions Precautions: Knee Precaution Booklet Issued: Yes (comment) Precaution Comments: Reviewed no pillow under knee. Restrictions Weight Bearing Restrictions: Yes LLE Weight Bearing: Partial weight bearing LLE Partial Weight Bearing Percentage or Pounds: 50      Mobility Bed Mobility               General bed mobility comments: OOB in recliner upon arrival  Transfers Overall transfer level: Needs assistance Equipment used: Rolling walker (2 wheeled) Transfers: Sit to/from Stand Sit to Stand: Supervision              Balance Overall balance assessment: Needs assistance Sitting-balance support: No upper extremity supported;Feet supported Sitting balance-Leahy Scale: Normal     Standing balance support: Bilateral upper extremity supported;During functional activity Standing balance-Leahy Scale: Fair                             ADL either performed or assessed with clinical judgement   ADL Overall ADL's : Needs assistance/impaired Eating/Feeding: Independent;Sitting   Grooming: Wash/dry hands;Wash/dry face;Standing;Supervision/safety   Upper Body Bathing: Set up;Sitting   Lower Body Bathing: Minimal assistance;With caregiver  independent assisting   Upper Body Dressing : Set up;Sitting   Lower Body Dressing: Minimal assistance;With caregiver independent assisting   Toilet Transfer: Supervision/safety;RW;Comfort height toilet;Ambulation   Toileting- Clothing Manipulation and Hygiene: Min guard;Sit to/from stand   Tub/ Engineer, structuralhower Transfer: Supervision/safety;3 in 1;Ambulation;Rolling walker   Functional mobility during ADLs: Supervision/safety;Rolling walker;Caregiver able to provide necessary level of assistance General ADL Comments: pt has all necessary DME and A/E at home from previous knee surgery     Vision Patient Visual Report: No change from baseline       Perception     Praxis      Pertinent Vitals/Pain Pain Assessment: 0-10 Pain Score: 2  Pain Location: L knee Pain Descriptors / Indicators: Aching;Operative site guarding Pain Intervention(s): Monitored during session;Repositioned     Hand Dominance Right   Extremity/Trunk Assessment Upper Extremity Assessment Upper Extremity Assessment: Overall WFL for tasks assessed   Lower Extremity Assessment Lower Extremity Assessment: Defer to PT evaluation   Cervical / Trunk Assessment Cervical / Trunk Assessment: Normal   Communication Communication Communication: No difficulties   Cognition Arousal/Alertness: Awake/alert Behavior During Therapy: WFL for tasks assessed/performed Overall Cognitive Status: Within Functional Limits for tasks assessed                                     General Comments  Py very pleasant and cooperative    Exercises    Shoulder Instructions  Home Living Family/patient expects to be discharged to:: Private residence Living Arrangements: Children Available Help at Discharge: Family;Available 24 hours/day Type of Home: House Home Access: Stairs to enter Entergy CorporationEntrance Stairs-Number of Steps: 1 Entrance Stairs-Rails: None Home Layout: Two level;Able to live on main level with  bedroom/bathroom     Bathroom Shower/Tub: Chief Strategy OfficerTub/shower unit   Bathroom Toilet: Standard     Home Equipment: Environmental consultantWalker - 2 wheels;Walker - 4 wheels;Bedside commode;Cane - quad          Prior Functioning/Environment Level of Independence: Independent                 OT Problem List: Decreased activity tolerance;Impaired balance (sitting and/or standing);Pain      OT Treatment/Interventions:      OT Goals(Current goals can be found in the care plan section) Acute Rehab OT Goals Patient Stated Goal: to go home  OT Goal Formulation: With patient/family  OT Frequency:     Barriers to D/C:    no barriers       Co-evaluation              AM-PAC PT "6 Clicks" Daily Activity     Outcome Measure Help from another person eating meals?: None Help from another person taking care of personal grooming?: A Little Help from another person toileting, which includes using toliet, bedpan, or urinal?: A Little Help from another person bathing (including washing, rinsing, drying)?: A Little Help from another person to put on and taking off regular upper body clothing?: None Help from another person to put on and taking off regular lower body clothing?: A Little 6 Click Score: 20   End of Session Equipment Utilized During Treatment: Gait belt;Rolling walker CPM Left Knee CPM Left Knee: Off  Activity Tolerance: Patient tolerated treatment well Patient left: in chair;with call bell/phone within reach;with family/visitor present  OT Visit Diagnosis: Unsteadiness on feet (R26.81);Other abnormalities of gait and mobility (R26.89);Pain Pain - Right/Left: Left Pain - part of body: Knee                Time: 1201-1219 OT Time Calculation (min): 18 min Charges:  OT General Charges $OT Visit: 1 Visit OT Evaluation $OT Eval Low Complexity: 1 Low G-Codes: OT G-codes **NOT FOR INPATIENT CLASS** Functional Assessment Tool Used: AM-PAC 6 Clicks Daily Activity Functional Limitation: Other  OT primary Other OT Primary Current Status (Z6109(G8990): At least 20 percent but less than 40 percent impaired, limited or restricted Other OT Primary Goal Status (U0454(G8991): At least 20 percent but less than 40 percent impaired, limited or restricted Other OT Primary Discharge Status [phone]([id number]): At least 20 percent but less than 40 percent impaired, limited or restricted     Galen ManilaSpencer, Calix Heinbaugh Jeanette 07/19/2017, 1:05 PM

## 2017-07-19 NOTE — Progress Notes (Signed)
Physical Therapy Treatment and Discharge Patient Details Name: [name] MRN: [id number] DOB: [birth year] Today's Date: 07/19/2017    History of Present Illness Pt is a 76 y/o female s/p elective L TKA. PMH includes OSA, depression, neck surgery, and R TKA.     PT Comments    Session focused on progressing ambulation and stair training. Pt able to walk longer distances than she will be needing to for first week return to home at this time safely with RW, as well as ambulate step into home. Pt educated on safety considerations for home and has no further questions or concerns at this time. Pt has met all functional goals and will benefit from skilled home health PT when medically cleared for d/c.     Follow Up Recommendations  DC plan and follow up therapy as arranged by surgeon;Supervision for mobility/OOB     Equipment Recommendations  None recommended by PT    Recommendations for Other Services       Precautions / Restrictions Precautions Precautions: Knee Precaution Booklet Issued: Yes (comment) Precaution Comments: Reviewed supine ther ex with pt, no pillow under knee. Restrictions Weight Bearing Restrictions: Yes LLE Weight Bearing: Partial weight bearing LLE Partial Weight Bearing Percentage or Pounds: 50    Mobility  Bed Mobility               General bed mobility comments: OOB upon entry  Transfers Overall transfer level: Needs assistance Equipment used: Rolling walker (2 wheeled) Transfers: Sit to/from Stand Sit to Stand: Supervision            Ambulation/Gait Ambulation/Gait assistance: Min guard Ambulation Distance (Feet): 150 Feet Assistive device: Rolling walker (2 wheeled) Gait Pattern/deviations: Step-to pattern;Step-through pattern;Decreased step length - right;Antalgic Gait velocity: Decreased   General Gait Details: slow, antalgic gait. cues for offloading RLE during L swing phase by using UE support on RW. increased distance from  prior session   Stairs Stairs: Yes   Stair Management: With walker Number of Stairs: 6 General stair comments: platform stairs per home setup. Cues for safe use of RW and sequencing with WB precautions. Pt able to demosntrate safely   Wheelchair Mobility    Modified Rankin (Stroke Patients Only)       Balance Overall balance assessment: Needs assistance Sitting-balance support: No upper extremity supported;Feet supported Sitting balance-Leahy Scale: Normal     Standing balance support: Bilateral upper extremity supported;During functional activity Standing balance-Leahy Scale: Fair                              Cognition Arousal/Alertness: Awake/alert Behavior During Therapy: WFL for tasks assessed/performed Overall Cognitive Status: Within Functional Limits for tasks assessed                                        Exercises Total Joint Exercises Ankle Circles/Pumps: AROM;Both;20 reps Quad Sets: AROM;Left;10 reps Heel Slides: AROM;Left;10 reps Hip ABduction/ADduction: AROM;Left;10 reps    General Comments General comments (skin integrity, edema, etc.): VSS throughtout session.      Pertinent Vitals/Pain Pain Assessment: 0-10 Pain Score: 4  Pain Location: L knee Pain Descriptors / Indicators: Aching;Operative site guarding Pain Intervention(s): Limited activity within patient's tolerance;Monitored during session    Home Living  Prior Function            PT Goals (current goals can now be found in the care plan section) Acute Rehab PT Goals Patient Stated Goal: to go home  PT Goal Formulation: With patient Time For Goal Achievement: 07/25/17 Potential to Achieve Goals: Good Progress towards PT goals: Goals met/education completed, patient discharged from PT    Frequency           PT Plan Current plan remains appropriate    Co-evaluation              AM-PAC PT "6 Clicks" Daily  Activity  Outcome Measure  Difficulty turning over in bed (including adjusting bedclothes, sheets and blankets)?: A Little Difficulty moving from lying on back to sitting on the side of the bed? : A Little Difficulty sitting down on and standing up from a chair with arms (e.g., wheelchair, bedside commode, etc,.)?: A Little Help needed moving to and from a bed to chair (including a wheelchair)?: A Little Help needed walking in hospital room?: A Little Help needed climbing 3-5 steps with a railing? : A Little 6 Click Score: 18    End of Session Equipment Utilized During Treatment: Gait belt Activity Tolerance: Patient tolerated treatment well Patient left: in chair;with call bell/phone within reach Nurse Communication: Mobility status PT Visit Diagnosis: Other abnormalities of gait and mobility (R26.89);Pain Pain - Right/Left: Left Pain - part of body: Knee     Time: 0935-1003 PT Time Calculation (min) (ACUTE ONLY): 28 min  Charges:  $Gait Training: 23-37 mins                    G Codes:  Functional Assessment Tool Used: AM-PAC 6 Clicks Basic Mobility;Clinical judgement Functional Limitation: Mobility: Walking and moving around Mobility: Walking and Moving Around Current Status ([id number]): At least 20 percent but less than 40 percent impaired, limited or restricted Mobility: Walking and Moving Around Goal Status [phone]): At least 20 percent but less than 40 percent impaired, limited or restricted Mobility: Walking and Moving Around Discharge Status [phone]): At least 20 percent but less than 40 percent impaired, limited or restricted    Reinaldo Berber, PT, DPT Acute Rehab Services Pager: [place] 07/19/2017, 10:14 AM

## 2017-07-31 ENCOUNTER — Other Ambulatory Visit (INDEPENDENT_AMBULATORY_CARE_PROVIDER_SITE_OTHER): Payer: Self-pay

## 2017-07-31 ENCOUNTER — Ambulatory Visit (INDEPENDENT_AMBULATORY_CARE_PROVIDER_SITE_OTHER): Payer: Medicare Other

## 2017-07-31 ENCOUNTER — Encounter (INDEPENDENT_AMBULATORY_CARE_PROVIDER_SITE_OTHER): Payer: Self-pay | Admitting: Orthopaedic Surgery

## 2017-07-31 ENCOUNTER — Ambulatory Visit (INDEPENDENT_AMBULATORY_CARE_PROVIDER_SITE_OTHER): Payer: Medicare Other | Admitting: Orthopaedic Surgery

## 2017-07-31 VITALS — BP 114/71 | HR 95 | Ht 66.0 in | Wt 185.0 lb

## 2017-07-31 DIAGNOSIS — Z96652 Presence of left artificial knee joint: Secondary | ICD-10-CM | POA: Diagnosis not present

## 2017-07-31 MED ORDER — OXYCODONE HCL 5 MG PO TABS: 5.0000 mg | ORAL_TABLET | Freq: Three times a day (TID) | ORAL | 0 refills | Status: DC | PRN

## 2017-07-31 NOTE — Progress Notes (Signed)
Office Visit Note   Patient: Kristen MooresBarbara E Leonard           Date of Birth: Jan 22, 1941           MRN: [id number] Visit Date: 07/31/2017              Requested by: Blair HeysEhinger, Robert, MD [address] [place], [place] [address] PCP: Blair HeysEhinger, Robert, MD   Assessment & Plan: Visit Diagnoses:  1. Presence of left artificial knee joint   2. History of left knee replacement     Plan: 2 weeks status post primary left total knee replacement doing well. Independent with a walker. May now stop xarelto. Weight-bear as tolerated. Renew oxycodone prescription. Office 2 weeks Follow-Up Instructions: Return in about 2 weeks (around 08/14/2017).   Orders:  Orders Placed This Encounter  Procedures  . XR KNEE 3 VIEW LEFT   Meds ordered this encounter  Medications  . oxyCODONE (OXY IR/ROXICODONE) 5 MG immediate release tablet    Sig: Take 1-2 tablets (5-10 mg total) by mouth every 8 (eight) hours as needed for moderate pain or severe pain.    Dispense:  30 tablet    Refill:  0      Procedures: No procedures performed   Clinical Data: No additional findings.   Subjective: Chief Complaint  Patient presents with  . Left Knee - Routine Post Op    [name] is a 76 y o S/P 2 weeks Left TKA. Pt ambulates with a walker.    HPI  Review of Systems  Constitutional: Negative for chills, fatigue and fever.  Eyes: Negative for itching.  Respiratory: Negative for chest tightness and shortness of breath.   Cardiovascular: Negative for chest pain, palpitations and leg swelling.  Gastrointestinal: Negative for blood in stool, constipation and diarrhea.  Endocrine: Negative for polyuria.  Genitourinary: Negative for dysuria.  Musculoskeletal: Negative for back pain, joint swelling, neck pain and neck stiffness.  Allergic/Immunologic: Negative for immunocompromised state.  Neurological: Negative for dizziness and numbness.  Hematological: Does not bruise/bleed easily.    Psychiatric/Behavioral: Positive for sleep disturbance. The patient is not nervous/anxious.      Objective: Vital Signs: BP 114/71   Pulse 95   Ht 5\' 6"  (1.676 m)   Wt 185 lb (83.9 kg)   BMI 29.86 kg/m   Physical Exam  Ortho Exam left knee wound healing without problem. Clips removed and new Steri-Strips applied. No calf pain. No distal edema. Neurovascular exam intact. Range of motion about 5-90. No instability. Wound looks fine without evidence of infection  Specialty Comments:  No specialty comments available.  Imaging: Xr Knee 3 View Left  Result Date: 07/31/2017 Films of the left knee were obtained in several projections. The left total knee replacement appears to be in excellent position. There is some ectopic  methacrylate behind the knee.    PMFS History: Patient Active Problem List   Diagnosis Date Noted  . S/P total knee replacement using cement, left 07/18/2017  . S/P TKR (total knee replacement) using cement, right 02/28/2017  . Unilateral primary osteoarthritis, left knee 01/05/2017  . Unilateral primary osteoarthritis, right knee 01/05/2017  . Atypical chest pain 05/22/2012   Past Medical History:  Diagnosis Date  . Acid reflux disease   . Arthritis   . Atypical chest pain   . Back pain   . Depression   . Gastroparesis   . H/O: hysterectomy   . Hiatal hernia   . Hypothyroidism   .  Neck pain   . Self inflicted injury   . Sleep apnea     Family History  Problem Relation Age of Onset  . Alzheimer's disease Mother   . Renal Disease Father     Past Surgical History:  Procedure Laterality Date  . ABDOMINAL HYSTERECTOMY    . COLONOSCOPY W/ BIOPSIES AND POLYPECTOMY    . NECK SURGERY     Metal plate in neck for degenerative disc disease  . TOTAL KNEE ARTHROPLASTY Right 02/28/2017   Procedure: RIGHT TOTAL KNEE ARTHROPLASTY;  Surgeon: Valeria BatmanWhitfield, Tiphany Fayson W, MD;  [facility];  Service: Orthopedics;  Laterality: Right;  . TOTAL KNEE ARTHROPLASTY Left  07/18/2017   Procedure: LEFT TOTAL KNEE ARTHROPLASTY;  Surgeon: Valeria BatmanWhitfield, Kaedance Magos W, MD;  Location: [facility] OR;  Service: Orthopedics;  Laterality: Left;   Social History   Occupational History  . Not on file  Tobacco Use  . Smoking status: Never Smoker  . Smokeless tobacco: Never Used  Substance and Sexual Activity  . Alcohol use: No  . Drug use: No  . Sexual activity: Not on file

## 2017-08-02 ENCOUNTER — Other Ambulatory Visit (INDEPENDENT_AMBULATORY_CARE_PROVIDER_SITE_OTHER): Payer: Self-pay | Admitting: Orthopedic Surgery

## 2017-08-02 MED ORDER — OXYCODONE HCL 5 MG PO TABS: 5.0000 mg | ORAL_TABLET | Freq: Three times a day (TID) | ORAL | 0 refills | Status: DC | PRN

## 2017-08-11 ENCOUNTER — Emergency Department (HOSPITAL_COMMUNITY)
Admission: EM | Admit: 2017-08-11 | Discharge: 2017-08-11 | Disposition: A | Discharge status: Home / Self Care | Payer: Medicare Other | Attending: Emergency Medicine | Admitting: Emergency Medicine

## 2017-08-11 ENCOUNTER — Emergency Department (HOSPITAL_COMMUNITY): Payer: Medicare Other

## 2017-08-11 ENCOUNTER — Encounter (HOSPITAL_COMMUNITY): Payer: Self-pay | Admitting: Emergency Medicine

## 2017-08-11 DIAGNOSIS — Z7901 Long term (current) use of anticoagulants: Secondary | ICD-10-CM | POA: Insufficient documentation

## 2017-08-11 DIAGNOSIS — R0602 Shortness of breath: Secondary | ICD-10-CM | POA: Insufficient documentation

## 2017-08-11 DIAGNOSIS — Z79899 Other long term (current) drug therapy: Secondary | ICD-10-CM | POA: Insufficient documentation

## 2017-08-11 DIAGNOSIS — Z96653 Presence of artificial knee joint, bilateral: Secondary | ICD-10-CM | POA: Insufficient documentation

## 2017-08-11 DIAGNOSIS — R531 Weakness: Secondary | ICD-10-CM | POA: Diagnosis present

## 2017-08-11 DIAGNOSIS — E039 Hypothyroidism, unspecified: Secondary | ICD-10-CM | POA: Insufficient documentation

## 2017-08-11 LAB — CBC WITH DIFFERENTIAL/PLATELET
Basophils Absolute: 0.1 10*3/uL (ref 0.0–0.1)
Basophils Relative: 2 %
EOS PCT: 5 %
Eosinophils Absolute: 0.2 10*3/uL (ref 0.0–0.7)
HEMATOCRIT: 36.2 % (ref 36.0–46.0)
Hemoglobin: 11.6 g/dL — ABNORMAL LOW (ref 12.0–15.0)
LYMPHS ABS: 1.2 10*3/uL (ref 0.7–4.0)
LYMPHS PCT: 30 %
MCH: 29 pg (ref 26.0–34.0)
MCHC: 32 g/dL (ref 30.0–36.0)
MCV: 90.5 fL (ref 78.0–100.0)
Monocytes Absolute: 0.3 10*3/uL (ref 0.1–1.0)
Monocytes Relative: 6 %
Neutro Abs: 2.3 10*3/uL (ref 1.7–7.7)
Neutrophils Relative %: 57 %
PLATELETS: 329 10*3/uL (ref 150–400)
RBC: 4 MIL/uL (ref 3.87–5.11)
RDW: 14.6 % (ref 11.5–15.5)
WBC: 4 10*3/uL (ref 4.0–10.5)

## 2017-08-11 LAB — URINALYSIS, ROUTINE W REFLEX MICROSCOPIC
BILIRUBIN URINE: NEGATIVE
Glucose, UA: NEGATIVE mg/dL
HGB URINE DIPSTICK: NEGATIVE
KETONES UR: 5 mg/dL — AB
Leukocytes, UA: NEGATIVE
NITRITE: NEGATIVE
Protein, ur: NEGATIVE mg/dL
Specific Gravity, Urine: 1.012 (ref 1.005–1.030)
pH: 6 (ref 5.0–8.0)

## 2017-08-11 LAB — COMPREHENSIVE METABOLIC PANEL
ALK PHOS: 100 U/L (ref 38–126)
ALT: 19 U/L (ref 14–54)
AST: 24 U/L (ref 15–41)
Albumin: 3.8 g/dL (ref 3.5–5.0)
Anion gap: 9 (ref 5–15)
BILIRUBIN TOTAL: 0.9 mg/dL (ref 0.3–1.2)
BUN: 12 mg/dL (ref 6–20)
CHLORIDE: 109 mmol/L (ref 101–111)
CO2: 26 mmol/L (ref 22–32)
CREATININE: 0.66 mg/dL (ref 0.44–1.00)
Calcium: 9.6 mg/dL (ref 8.9–10.3)
Glucose, Bld: 109 mg/dL — ABNORMAL HIGH (ref 65–99)
Potassium: 3.7 mmol/L (ref 3.5–5.1)
Sodium: 144 mmol/L (ref 135–145)
TOTAL PROTEIN: 6.9 g/dL (ref 6.5–8.1)

## 2017-08-11 LAB — INFLUENZA PANEL BY PCR (TYPE A & B)
INFLAPCR: NEGATIVE
Influenza B By PCR: NEGATIVE

## 2017-08-11 LAB — I-STAT CG4 LACTIC ACID, ED: LACTIC ACID, VENOUS: 0.77 mmol/L (ref 0.5–1.9)

## 2017-08-11 MED ORDER — MORPHINE SULFATE (PF) 4 MG/ML IV SOLN
4.0000 mg | Freq: Once | INTRAVENOUS | Status: AC
  Administered 2017-08-11: 4 mg via INTRAVENOUS
  Filled 2017-08-11: qty 1

## 2017-08-11 MED ORDER — SODIUM CHLORIDE 0.9 % IV SOLN
INTRAVENOUS | Status: DC
  Administered 2017-08-11: 125 mL/h via INTRAVENOUS

## 2017-08-11 NOTE — ED Triage Notes (Signed)
Pt brought in from home c/o SOB and intermittent chest pain since yesterday and cough for 2 weeks. EMS reports rales in lower lobes.  Patient had left knee replacement last month and started PT yesterday and now pain medications not helping. 18g left AC.

## 2017-08-11 NOTE — ED Triage Notes (Signed)
patient reports that she had SOB yesterday but today her body is very fatigued and feels like "my body can't move, not like it's numb, but I just cant move it'. Patient having pain in left knee and some in right knee.

## 2017-08-11 NOTE — ED Provider Notes (Signed)
[facility]-EMERGENCY DEPT Provider Note   CSN: [id number] Arrival date & time: 08/11/17  1006     History   Chief Complaint Chief Complaint  Patient presents with  . Fatigue    HPI [name] is a 76 y.o. female.  76 year old female presents with 1 day of diffuse weakness.  Has had a nonproductive cough without fever or chills.  No vomiting or diarrhea.  No URI symptoms.  Denies any urinary symptoms.  No abdominal or chest discomfort.  No new medications.  Symptoms persistent makes them better or worse.  Denies any headache or visual changes.  No treatment use prior to arrival.  Did recently have right knee replacement.      Past Medical History:  Diagnosis Date  . Acid reflux disease   . Arthritis   . Atypical chest pain   . Back pain   . Depression   . Gastroparesis   . H/O: hysterectomy   . Hiatal hernia   . Hypothyroidism   . Neck pain   . Self inflicted injury   . Sleep apnea     Patient Active Problem List   Diagnosis Date Noted  . S/P total knee replacement using cement, left 07/18/2017  . S/P TKR (total knee replacement) using cement, right 02/28/2017  . Unilateral primary osteoarthritis, left knee 01/05/2017  . Unilateral primary osteoarthritis, right knee 01/05/2017  . Atypical chest pain 05/22/2012    Past Surgical History:  Procedure Laterality Date  . ABDOMINAL HYSTERECTOMY    . COLONOSCOPY W/ BIOPSIES AND POLYPECTOMY    . NECK SURGERY     Metal plate in neck for degenerative disc disease  . TOTAL KNEE ARTHROPLASTY Right 02/28/2017   Procedure: RIGHT TOTAL KNEE ARTHROPLASTY;  Surgeon: Valeria BatmanWhitfield, Peter W, MD;  [facility];  Service: Orthopedics;  Laterality: Right;  . TOTAL KNEE ARTHROPLASTY Left 07/18/2017   Procedure: LEFT TOTAL KNEE ARTHROPLASTY;  Surgeon: Valeria BatmanWhitfield, Peter W, MD;  Location: [facility] OR;  Service: Orthopedics;  Laterality: Left;    OB History    No data available       Home Medications    Prior  to Admission medications   Medication Sig Start Date End Date Taking? Authorizing Provider  albuterol (PROVENTIL HFA;VENTOLIN HFA) 108 (90 Base) MCG/ACT inhaler Inhale 2 puffs every 6 (six) hours as needed into the lungs for wheezing or shortness of breath.  01/15/14   [provider]  levothyroxine (SYNTHROID, LEVOTHROID) 75 MCG tablet Take 75 mcg by mouth daily before breakfast.  05/12/12   [provider]  methocarbamol (ROBAXIN) 500 MG tablet Take 1 tablet (500 mg total) by mouth every 8 (eight) hours as needed for muscle spasms. 07/19/17   Jetty PeeksPetrarca, Brian D, PA-C  Multiple Vitamins-Minerals (MULTIVITAMIN WITH MINERALS) tablet Take 1 tablet by mouth daily.    [provider]  oxyCODONE (OXY IR/ROXICODONE) 5 MG immediate release tablet Take 1-2 tablets (5-10 mg total) by mouth every 8 (eight) hours as needed for moderate pain or severe pain. 08/02/17   Jetty PeeksPetrarca, Brian D, PA-C  rivaroxaban (XARELTO) 10 MG TABS tablet Take 1 tablet (10 mg total) by mouth at bedtime. 07/19/17   Jetty PeeksPetrarca, Brian D, PA-C    Family History Family History  Problem Relation Age of Onset  . Alzheimer's disease Mother   . Renal Disease Father     Social History Social History   Tobacco Use  . Smoking status: Never Smoker  . Smokeless tobacco: Never Used  Substance Use Topics  . Alcohol use: No  . Drug use: No     Allergies   Penicillins   Review of Systems Review of Systems  All other systems reviewed and are negative.    Physical Exam Updated Vital Signs BP 136/82 (BP Location: Right Arm)   Pulse 81   Temp 98.5 F (36.9 C) (Oral)   Resp 17   Ht 1.651 m (5\' 5" )   Wt 83.9 kg (185 lb)   SpO2 98%   BMI 30.79 kg/m   Physical Exam  Constitutional: She is oriented to person, place, and time. She appears well-developed and well-nourished.  Non-toxic appearance. No distress.  HENT:  Head: Normocephalic and atraumatic.  Eyes: Conjunctivae, EOM and lids are normal. Pupils  are equal, round, and reactive to light.  Neck: Normal range of motion. Neck supple. No tracheal deviation present. No thyroid mass present.  Cardiovascular: Normal rate, regular rhythm and normal heart sounds. Exam reveals no gallop.  No murmur heard. Pulmonary/Chest: Effort normal and breath sounds normal. No stridor. No respiratory distress. She has no decreased breath sounds. She has no wheezes. She has no rhonchi. She has no rales.  Abdominal: Soft. Normal appearance and bowel sounds are normal. She exhibits no distension. There is no tenderness. There is no rebound and no CVA tenderness.  Musculoskeletal: Normal range of motion. She exhibits no edema or tenderness.  Neurological: She is alert and oriented to person, place, and time. She has normal strength. No cranial nerve deficit or sensory deficit. GCS eye subscore is 4. GCS verbal subscore is 5. GCS motor subscore is 6.  Skin: Skin is warm and dry. No abrasion and no rash noted.  Psychiatric: She has a normal mood and affect. Her speech is normal and behavior is normal.  Nursing note and vitals reviewed.    ED Treatments / Results  Labs (all labs ordered are listed, but only abnormal results are displayed) Labs Reviewed  URINE CULTURE  CULTURE, BLOOD (ROUTINE X 2)  CULTURE, BLOOD (ROUTINE X 2)  CBC WITH DIFFERENTIAL/PLATELET  COMPREHENSIVE METABOLIC PANEL  URINALYSIS, ROUTINE W REFLEX MICROSCOPIC  INFLUENZA PANEL BY PCR (TYPE A & B)  I-STAT CG4 LACTIC ACID, ED    EKG  EKG Interpretation None       Radiology No results found.  Procedures Procedures (including critical care time)  Medications Ordered in ED Medications  0.9 %  sodium chloride infusion (not administered)     Initial Impression / Assessment and Plan / ED Course  I have reviewed the triage vital signs and the nursing notes.  Pertinent labs & imaging results that were available during my care of the patient were reviewed by me and considered in my  medical decision making (see chart for details).    Patient's workup here without acute etiology for her symptoms.  Her urinalysis, chest x-ray, flu test were all negative.  Her electrolytes were normal limits.  Her CBC is without significant leukocytosis or anemia.  She feels better will be discharged home  Final Clinical Impressions(s) / ED Diagnoses   Final diagnoses:  SOB (shortness of breath)    ED Discharge Orders    None       Lorre NickAllen, Josue Falconi, MD 08/11/17 1557

## 2017-08-12 ENCOUNTER — Telehealth (HOSPITAL_BASED_OUTPATIENT_CLINIC_OR_DEPARTMENT_OTHER): Payer: Self-pay | Admitting: Emergency Medicine

## 2017-08-12 LAB — BLOOD CULTURE ID PANEL (REFLEXED)
ACINETOBACTER BAUMANNII: NOT DETECTED
CANDIDA ALBICANS: NOT DETECTED
CANDIDA GLABRATA: NOT DETECTED
CANDIDA PARAPSILOSIS: NOT DETECTED
Candida krusei: NOT DETECTED
Candida tropicalis: NOT DETECTED
ENTEROBACTER CLOACAE COMPLEX: NOT DETECTED
ENTEROBACTERIACEAE SPECIES: NOT DETECTED
Enterococcus species: NOT DETECTED
Escherichia coli: NOT DETECTED
HAEMOPHILUS INFLUENZAE: NOT DETECTED
KLEBSIELLA OXYTOCA: NOT DETECTED
Klebsiella pneumoniae: NOT DETECTED
Listeria monocytogenes: NOT DETECTED
Neisseria meningitidis: NOT DETECTED
Proteus species: NOT DETECTED
Pseudomonas aeruginosa: NOT DETECTED
STREPTOCOCCUS AGALACTIAE: NOT DETECTED
STREPTOCOCCUS PYOGENES: NOT DETECTED
STREPTOCOCCUS SPECIES: NOT DETECTED
Serratia marcescens: NOT DETECTED
Staphylococcus aureus (BCID): NOT DETECTED
Staphylococcus species: NOT DETECTED
Streptococcus pneumoniae: NOT DETECTED

## 2017-08-12 LAB — URINE CULTURE: CULTURE: NO GROWTH

## 2017-08-14 ENCOUNTER — Encounter (INDEPENDENT_AMBULATORY_CARE_PROVIDER_SITE_OTHER): Payer: Self-pay | Admitting: Orthopaedic Surgery

## 2017-08-14 ENCOUNTER — Ambulatory Visit (INDEPENDENT_AMBULATORY_CARE_PROVIDER_SITE_OTHER): Payer: Medicare Other | Admitting: Orthopaedic Surgery

## 2017-08-14 VITALS — BP 142/74 | HR 86 | Resp 14 | Ht 64.0 in | Wt 200.0 lb

## 2017-08-14 DIAGNOSIS — Z96652 Presence of left artificial knee joint: Secondary | ICD-10-CM

## 2017-08-14 LAB — CULTURE, BLOOD (ROUTINE X 2): SPECIAL REQUESTS: ADEQUATE

## 2017-08-14 NOTE — Progress Notes (Deleted)
Office Visit Note   Patient: [name]           Date of Birth: 03/09/1941           MRN: [id number] Visit Date: 08/14/2017              Requested by: Blair HeysEhinger, Robert, MD [address] [place], [place] [address] PCP: Blair HeysEhinger, Robert, MD   Assessment & Plan: Visit Diagnoses:  1. S/P total knee replacement using cement, left     Plan: ***  Follow-Up Instructions: Return in about 2 months (around 10/15/2017).   Orders:  No orders of the defined types were placed in this encounter.  No orders of the defined types were placed in this encounter.     Procedures: No procedures performed   Clinical Data: No additional findings.   Subjective: Chief Complaint  Patient presents with  . Left Knee - Routine Post Op    [name] is a 76 y o S/P 1 month Left TKA. She is doing well, no pain meds,  Cane for safety    HPI  Review of Systems  Constitutional: Negative for chills, fatigue and fever.  Eyes: Negative for itching.  Respiratory: Negative for chest tightness and shortness of breath.   Cardiovascular: Negative for chest pain, palpitations and leg swelling.  Gastrointestinal: Negative for blood in stool, constipation and diarrhea.  Endocrine: Negative for polyuria.  Genitourinary: Negative for dysuria.  Musculoskeletal: Negative for back pain, joint swelling, neck pain and neck stiffness.  Allergic/Immunologic: Negative for immunocompromised state.  Neurological: Negative for dizziness and numbness.  Hematological: Does not bruise/bleed easily.  Psychiatric/Behavioral: The patient is not nervous/anxious.      Objective: Vital Signs: BP (!) 142/74   Pulse 86   Resp 14   Ht 5\' 4"  (1.626 m)   Wt 200 lb (90.7 kg)   BMI 34.33 kg/m   Physical Exam  Ortho Exam  Specialty Comments:  No specialty comments available.  Imaging: No results found.   PMFS History: Patient Active Problem List   Diagnosis Date Noted  . S/P total knee  replacement using cement, left 07/18/2017  . S/P TKR (total knee replacement) using cement, right 02/28/2017  . Unilateral primary osteoarthritis, left knee 01/05/2017  . Unilateral primary osteoarthritis, right knee 01/05/2017  . Atypical chest pain 05/22/2012   Past Medical History:  Diagnosis Date  . Acid reflux disease   . Arthritis   . Atypical chest pain   . Back pain   . Depression   . Gastroparesis   . H/O: hysterectomy   . Hiatal hernia   . Hypothyroidism   . Neck pain   . Self inflicted injury   . Sleep apnea     Family History  Problem Relation Age of Onset  . Alzheimer's disease Mother   . Renal Disease Father     Past Surgical History:  Procedure Laterality Date  . ABDOMINAL HYSTERECTOMY    . COLONOSCOPY W/ BIOPSIES AND POLYPECTOMY    . NECK SURGERY     Metal plate in neck for degenerative disc disease  . TOTAL KNEE ARTHROPLASTY Right 02/28/2017   Procedure: RIGHT TOTAL KNEE ARTHROPLASTY;  Surgeon: Valeria BatmanWhitfield, Peter W, MD;  [facility];  Service: Orthopedics;  Laterality: Right;  . TOTAL KNEE ARTHROPLASTY Left 07/18/2017   Procedure: LEFT TOTAL KNEE ARTHROPLASTY;  Surgeon: Valeria BatmanWhitfield, Peter W, MD;  Location: [facility] OR;  Service: Orthopedics;  Laterality: Left;   Social History  Occupational History  . Not on file  Tobacco Use  . Smoking status: Never Smoker  . Smokeless tobacco: Never Used  Substance and Sexual Activity  . Alcohol use: No  . Drug use: No  . Sexual activity: Not on file

## 2017-08-14 NOTE — Progress Notes (Signed)
Office Visit Note   Patient: [name]           Date of Birth: 1941/07/21           MRN: [id number] Visit Date: 08/14/2017              Requested by: Blair HeysEhinger, Robert, MD [address] [place], [place] [address] PCP: Blair HeysEhinger, Robert, MD   Assessment & Plan: Visit Diagnoses:  1. S/P total knee replacement using cement, left     Plan: One month status post left total knee replacement and doing quite well. Still uses a cane but not using any pain pills allergic to continue with her exercises and return in 2 months  Follow-Up Instructions: Return in about 2 months (around 10/15/2017).   Orders:  No orders of the defined types were placed in this encounter.  No orders of the defined types were placed in this encounter.     Procedures: No procedures performed   Clinical Data: No additional findings.   Subjective: Chief Complaint  Patient presents with  . Left Knee - Routine Post Op    [name] is a 76 y o S/P 1 month Left TKA. She is doing well, no pain meds,  Cane for safety  Using single-point cane for ambulation. No fever chills shortness of breath or chest pain. Had one episode of weakness and went to the emergency room and they thought was a combination of "factors" better now. She could've had "pneumonia".  HPI  Review of Systems   Objective: Vital Signs: BP (!) 142/74   Pulse 86   Resp 14   Ht 5\' 4"  (1.626 m)   Wt 200 lb (90.7 kg)   BMI 34.33 kg/m   Physical Exam  Ortho Exam awake alert and oriented 3. Comfortable sitting. Not short of breath. Minimal limp. Lacks just a few degrees to full extension left knee and flexed about 103 with a goniometer. No opening with a varus or valgus stress. Negative anterior drawer sign. Knee was slightly warm. Mild edema. No calf pain. No ankle swelling. Neurovascular exam intact  Specialty Comments:  No specialty comments available.  Imaging: No results found.   PMFS History: Patient Active  Problem List   Diagnosis Date Noted  . S/P total knee replacement using cement, left 07/18/2017  . S/P TKR (total knee replacement) using cement, right 02/28/2017  . Unilateral primary osteoarthritis, left knee 01/05/2017  . Unilateral primary osteoarthritis, right knee 01/05/2017  . Atypical chest pain 05/22/2012   Past Medical History:  Diagnosis Date  . Acid reflux disease   . Arthritis   . Atypical chest pain   . Back pain   . Depression   . Gastroparesis   . H/O: hysterectomy   . Hiatal hernia   . Hypothyroidism   . Neck pain   . Self inflicted injury   . Sleep apnea     Family History  Problem Relation Age of Onset  . Alzheimer's disease Mother   . Renal Disease Father     Past Surgical History:  Procedure Laterality Date  . ABDOMINAL HYSTERECTOMY    . COLONOSCOPY W/ BIOPSIES AND POLYPECTOMY    . NECK SURGERY     Metal plate in neck for degenerative disc disease  . TOTAL KNEE ARTHROPLASTY Right 02/28/2017   Procedure: RIGHT TOTAL KNEE ARTHROPLASTY;  Surgeon: Valeria BatmanWhitfield, Alyda Megna W, MD;  [facility];  Service: Orthopedics;  Laterality: Right;  . TOTAL KNEE ARTHROPLASTY  Left 07/18/2017   Procedure: LEFT TOTAL KNEE ARTHROPLASTY;  Surgeon: Valeria BatmanWhitfield, Ann-Marie Kluge W, MD;  [facility];  Service: Orthopedics;  Laterality: Left;   Social History   Occupational History  . Not on file  Tobacco Use  . Smoking status: Never Smoker  . Smokeless tobacco: Never Used  Substance and Sexual Activity  . Alcohol use: No  . Drug use: No  . Sexual activity: Not on file     Valeria BatmanPeter W Shavaun Osterloh, MD   Note - This record has been created using [employer] software.  Chart creation errors have been sought, but may not always  have been located. Such creation errors do not reflect on  the standard of medical care.

## 2017-08-15 ENCOUNTER — Telehealth: Payer: Self-pay | Admitting: Emergency Medicine

## 2017-08-15 ENCOUNTER — Telehealth (INDEPENDENT_AMBULATORY_CARE_PROVIDER_SITE_OTHER): Payer: Self-pay

## 2017-08-15 ENCOUNTER — Telehealth (INDEPENDENT_AMBULATORY_CARE_PROVIDER_SITE_OTHER): Payer: Self-pay | Admitting: *Deleted

## 2017-08-15 NOTE — Telephone Encounter (Signed)
Post ED Visit - Positive Culture Follow-up: Successful Patient Follow-Up  Culture assessed and recommendations reviewed by: []  Enzo BiNathan Batchelder, Pharm.D. []  Celedonio MiyamotoJeremy Frens, Pharm.D., BCPS AQ-ID []  Garvin FilaMike Maccia, Pharm.D., BCPS [x]  Georgina PillionElizabeth Martin, Pharm.D., BCPS []  RomevilleMinh Pham, [address]Pharm.D., BCPS, AAHIVP []  Estella HuskMichelle Turner, Pharm.D., BCPS, AAHIVP []  Lysle Pearlachel Rumbarger, PharmD, BCPS []  Casilda Carlsaylor Stone, PharmD, BCPS []  Pollyann SamplesAndy Johnston, PharmD, BCPS  Positive blood culture  []  Patient discharged without antimicrobial prescription and treatment is now indicated []  Organism is resistant to prescribed ED discharge antimicrobial [x]  Patient with positive blood cultures  Changes discussed with ED provider: Demetrios LollKenneth Leaphart PA Explained to patient [facility] pt to go to ED or PCP for repeat blood culture  Patient has spoken with PCP who is f/u with patient    Berle MullMiller, Ciclaly Mulcahey 08/15/2017, 1:15 PM

## 2017-08-15 NOTE — Progress Notes (Signed)
ED Antimicrobial Stewardship Positive Culture Follow Up   Kristen MooresBarbara E Leonard is an 76 y.o. female who [facility]2017 with a chief complaint of  Chief Complaint  Patient presents with  . Fatigue    Recent Results (from the past 720 hour(s))  Urine Culture     Status: None   Collection Time: 07/18/17  7:55 AM  Result Value Ref Range Status   Specimen Description URINE, CLEAN CATCH  Final   Special Requests NONE  Final   Culture NO GROWTH  Final   Report Status 07/19/2017 FINAL  Final  Culture, blood (Routine X 2) w Reflex to ID Panel     Status: Abnormal   Collection Time: 08/11/17 11:20 AM  Result Value Ref Range Status   Specimen Description BLOOD LEFT ANTECUBITAL  Final   Special Requests   Final    BOTTLES DRAWN AEROBIC AND ANAEROBIC Blood Culture adequate volume   Culture  Setup Time   Final    GRAM POSITIVE COCCI IN BOTH AEROBIC AND ANAEROBIC BOTTLES CRITICAL RESULT CALLED TO, READ BACK BY AND VERIFIED WITH: S GOUGE,RN AT 0753 08/12/17 BY L BENFIELD    Culture (A)  Final    STAPHYLOCOCCUS SPECIES (COAGULASE NEGATIVE) THE SIGNIFICANCE OF ISOLATING THIS ORGANISM FROM A SINGLE SET OF BLOOD CULTURES WHEN MULTIPLE SETS ARE DRAWN IS UNCERTAIN. PLEASE NOTIFY THE MICROBIOLOGY DEPARTMENT WITHIN ONE WEEK IF SPECIATION AND SENSITIVITIES ARE REQUIRED. Performed at [facility], [address] [place], [place] [address]    Report Status 08/14/2017 FINAL  Final  Blood Culture ID Panel (Reflexed)     Status: None   Collection Time: 08/11/17 11:20 AM  Result Value Ref Range Status   Enterococcus species NOT DETECTED NOT DETECTED Final   Listeria monocytogenes NOT DETECTED NOT DETECTED Final   Staphylococcus species NOT DETECTED NOT DETECTED Final   Staphylococcus aureus NOT DETECTED NOT DETECTED Final   Streptococcus species NOT DETECTED NOT DETECTED Final   Streptococcus agalactiae NOT DETECTED NOT DETECTED Final   Streptococcus pneumoniae NOT DETECTED NOT  DETECTED Final   Streptococcus pyogenes NOT DETECTED NOT DETECTED Final   Acinetobacter baumannii NOT DETECTED NOT DETECTED Final   Enterobacteriaceae species NOT DETECTED NOT DETECTED Final   Enterobacter cloacae complex NOT DETECTED NOT DETECTED Final   Escherichia coli NOT DETECTED NOT DETECTED Final   Klebsiella oxytoca NOT DETECTED NOT DETECTED Final   Klebsiella pneumoniae NOT DETECTED NOT DETECTED Final   Proteus species NOT DETECTED NOT DETECTED Final   Serratia marcescens NOT DETECTED NOT DETECTED Final   Haemophilus influenzae NOT DETECTED NOT DETECTED Final   Neisseria meningitidis NOT DETECTED NOT DETECTED Final   Pseudomonas aeruginosa NOT DETECTED NOT DETECTED Final   Candida albicans NOT DETECTED NOT DETECTED Final   Candida glabrata NOT DETECTED NOT DETECTED Final   Candida krusei NOT DETECTED NOT DETECTED Final   Candida parapsilosis NOT DETECTED NOT DETECTED Final   Candida tropicalis NOT DETECTED NOT DETECTED Final    Comment: Performed at [facility], [address] [place], [place] [address]  Culture, blood (Routine X 2) w Reflex to ID Panel     Status: None (Preliminary result)   Collection Time: 08/11/17 11:34 AM  Result Value Ref Range Status   Specimen Description BLOOD RIGHT ANTECUBITAL  Final   Special Requests   Final    BOTTLES DRAWN AEROBIC AND ANAEROBIC Blood Culture adequate volume   Culture   Final    NO  GROWTH 3 DAYS Performed at [facility], [address] [place], [place] [address]    Report Status PENDING  Incomplete  Urine Culture     Status: None   Collection Time: 08/11/17  3:14 PM  Result Value Ref Range Status   Specimen Description URINE, RANDOM  Final   Special Requests NONE  Final   Culture   Final    NO GROWTH Performed at [facility], [address] [place], [place] [address]    Report Status 08/12/2017 FINAL  Final    []  Needs additional follow-up  89 YOF with recent L-TKR on 07/18/17 who presented on  12/14 with weakness, fatigue, some knee pain. Felt better and was d/ced home. 1/2 blood culture resulted as Coag negative Staph - which could be a contaminant or pathogen given the patient's recent knee surgery.  Dr. Cleophas DunkerWhitfield saw the patient in the office on 12/17 and di note the knee was slightly warm with some mild edema noted. Dr. Cleophas DunkerWhitfield was contacted regarding the 1/2 positive blood culture and it was determined that the best option would be for the patient to repeat blood cultures. This can either be done back in the ED or with the patient's PCP.   Additional follow-up: Return to the ED for repeat blood cultures or follow-up with PCP for repeat blood cultures.   ED Provider: Azucena Kubayler Leaphart, PA-C  Kristen SimsMartin, [name] 08/15/2017, 9:24 AM Infectious Diseases Pharmacist Phone# [phone]

## 2017-08-15 NOTE — Telephone Encounter (Signed)
Dr. Cleophas DunkerWhitfield called. He received a [facility] from [facility] patients labs - blood culture abnormal. Patient will need f/u labs. [facility] will call to schedule lab draw. Dr. Cleophas DunkerWhitfield wants patient to f/u w/PCP. I called patient. She understood.

## 2017-08-15 NOTE — Telephone Encounter (Signed)
Pts PCP called to make sure that Dr. Cleophas DunkerWhitfield was aware of the abnormal labs. Hickman called ptr and told her [facility] would be calling her to redraw.  At 4:04 I LVMOM to ask if [facility] had reached out to her.

## 2017-08-16 LAB — CULTURE, BLOOD (ROUTINE X 2)
Culture: NO GROWTH
SPECIAL REQUESTS: ADEQUATE

## 2017-08-25 ENCOUNTER — Telehealth (INDEPENDENT_AMBULATORY_CARE_PROVIDER_SITE_OTHER): Payer: Self-pay

## 2017-08-25 NOTE — Telephone Encounter (Signed)
LVMOM to ask if pt has been seen by [place] [facility] closed when I called.

## 2017-10-16 ENCOUNTER — Ambulatory Visit (INDEPENDENT_AMBULATORY_CARE_PROVIDER_SITE_OTHER): Payer: Medicare Other | Admitting: Orthopaedic Surgery

## 2017-10-16 ENCOUNTER — Encounter (INDEPENDENT_AMBULATORY_CARE_PROVIDER_SITE_OTHER): Payer: Self-pay | Admitting: Orthopaedic Surgery

## 2017-10-16 VITALS — BP 151/77 | HR 75 | Resp 16 | Ht 64.0 in | Wt 160.0 lb

## 2017-10-16 DIAGNOSIS — Z96652 Presence of left artificial knee joint: Secondary | ICD-10-CM

## 2017-10-16 NOTE — Progress Notes (Signed)
Office Visit Note   Patient: Kristen MooresBarbara E Leonard           Date of Birth: February 21, 1941           MRN: [id number] Visit Date: 10/16/2017              Requested by: Kristen HeysEhinger, Robert, MD [address] [place], [place] [address] PCP: Kristen HeysEhinger, Robert, MD   Assessment & Plan: Visit Diagnoses:  1. History of left knee replacement     Plan: 3 months status post primary left total knee replacement. Very happy the results. No problems. I've urged her to work with exercises as her quads do seem a little bit weak see her back in 6 months  Follow-Up Instructions: Return in about 6 months (around 04/15/2018).   Orders:  No orders of the defined types were placed in this encounter.  No orders of the defined types were placed in this encounter.     Procedures: No procedures performed   Clinical Data: No additional findings.   Subjective: Chief Complaint  Patient presents with  . Left Knee - Routine Post Op    [name] is a 77 y o S/P 3 months Left knee replacement. Hx of Right TKA.  Very happy with her present course of treatment for her left knee replacement. SHe has finished a course of physical therapy. No fever or chills.  HPI  Review of Systems  Constitutional: Negative for chills, fatigue and fever.  HENT: Negative for hearing loss and tinnitus.   Eyes: Negative for itching.  Respiratory: Negative for chest tightness and shortness of breath.   Cardiovascular: Negative for chest pain, palpitations and leg swelling.  Gastrointestinal: Negative for blood in stool, constipation and diarrhea.  Endocrine: Negative for polyuria.  Genitourinary: Negative for dysuria.  Musculoskeletal: Negative for back pain, joint swelling, neck pain and neck stiffness.  Allergic/Immunologic: Negative for immunocompromised state.  Neurological: Negative for dizziness, numbness and headaches.  Hematological: Does not bruise/bleed easily.  Psychiatric/Behavioral: Negative for sleep  disturbance. The patient is not nervous/anxious.      Objective: Vital Signs: BP (!) 151/77   Pulse 75   Resp 16   Ht 5\' 4"  (1.626 m)   Wt 160 lb (72.6 kg)   BMI 27.46 kg/m   Physical Exam  Ortho Exam awake alert and oriented 3. Comfortable sitting. Full extent quick extension left knee but 110 of flexion. No opening with a varus or valgus stress little bit of an anterior drawer sign. Very minimal effusion. Knee wasn't warm or hot. Scar without any pain. No distal edema. Neurovascular exam intact. Walks without a limp straight leg raise negative. Painless range of motion of her hip  Specialty Comments:  No specialty comments available.  Imaging: No results found.   PMFS History: Patient Active Problem List   Diagnosis Date Noted  . S/P total knee replacement using cement, left 07/18/2017  . S/P TKR (total knee replacement) using cement, right 02/28/2017  . Unilateral primary osteoarthritis, left knee 01/05/2017  . Unilateral primary osteoarthritis, right knee 01/05/2017  . Atypical chest pain 05/22/2012   Past Medical History:  Diagnosis Date  . Acid reflux disease   . Arthritis   . Atypical chest pain   . Back pain   . Depression   . Gastroparesis   . H/O: hysterectomy   . Hiatal hernia   . Hypothyroidism   . Neck pain   . Self inflicted injury   . Sleep apnea  Family History  Problem Relation Age of Onset  . Alzheimer's disease Mother   . Renal Disease Father     Past Surgical History:  Procedure Laterality Date  . ABDOMINAL HYSTERECTOMY    . COLONOSCOPY W/ BIOPSIES AND POLYPECTOMY    . NECK SURGERY     Metal plate in neck for degenerative disc disease  . TOTAL KNEE ARTHROPLASTY Right 02/28/2017   Procedure: RIGHT TOTAL KNEE ARTHROPLASTY;  Surgeon: [name], MD;  Location: [facility] OR;  Service: Orthopedics;  Laterality: Right;  . TOTAL KNEE ARTHROPLASTY Left 07/18/2017   Procedure: LEFT TOTAL KNEE ARTHROPLASTY;  Surgeon: [name], MD;   Location: [facility] OR;  Service: Orthopedics;  Laterality: Left;   Social History   Occupational History  . Not on file  Tobacco Use  . Smoking status: Never Smoker  . Smokeless tobacco: Never Used  Substance and Sexual Activity  . Alcohol use: No  . Drug use: No  . Sexual activity: Not on file

## 2017-10-23 ENCOUNTER — Encounter: Payer: Self-pay | Admitting: Nurse Practitioner

## 2017-10-23 ENCOUNTER — Ambulatory Visit: Payer: Self-pay | Admitting: Nurse Practitioner

## 2017-10-23 VITALS — BP 130/90 | HR 71 | Temp 98.7°F | Resp 18 | Wt 165.4 lb

## 2017-10-23 DIAGNOSIS — S81812A Laceration without foreign body, left lower leg, initial encounter: Secondary | ICD-10-CM

## 2017-10-23 NOTE — Progress Notes (Signed)
  Subjective:    [name] is a 77 y.o. female who presents for evaluation of a laceration to the left lower extremity. Injury occurred today .The mechanism of the wound was ceramic soap dish that fell and "bounced" onto her left shin. The patient reports no coldness, no numbness, no pain, no paresthesias in the affected area. There were no other injuries.  Patient denies head injury, loss of consciousness, neck pain, chest pain, abdominal pain, numbness and weakness. The tetanus status is unknown.  Patient denies use of blood thinners, but does take NSAIDs as of today.  The following portions of the patient's history were reviewed and updated as appropriate: allergies, current medications and past medical history.  Review of Systems Constitutional: negative Respiratory: negative Cardiovascular: negative Integument/breast: positive for laceration to left anterior shin, negative for pruritus, rash and skin lesion(s) Musculoskeletal:negative    Objective:    BP 130/90 (BP Location: Left Arm, Patient Position: Sitting, Cuff Size: Normal)   Pulse 71   Temp 98.7 F (37.1 C) (Oral)   Resp 18   Wt 165 lb 6.4 oz (75 kg)   SpO2 98%   BMI 28.39 kg/m   There is a linear laceration measuring approximately 2 cm in length on the left shin. Examination of the wound for foreign bodies and devitalized tissue showed none.  Examination of the surrounding area for neural or vascular damage showed no signs of infection, wound is bleeding minimally   Site was cleaned with normal saline.  Bleeding has slowed.  Dermabond used to left shin.  Bleeding has ceased.  Steri-strips applied with Benzoin.  Antibiotic ointment applied.  Gauze dressing with coban applied.   Assessment:    2 cm left lower extremity laceration    Plan:    The wound was closed with Dermabond without incident. The wound was dressed and antibiotic ointment was applied. Wound care discussed. Tetanus immunization given. Follow-up  for wound recheck in 4 days. if needed.  Patient education provided for laceration, wound care and Tdap. Reviewed signs and symptoms of infection, redness, foul-smelling drainage, fever or pain to site. Patient and daughter verbalize understanding.

## 2017-10-23 NOTE — Patient Instructions (Addendum)
Laceration Care, Adult A laceration is a cut that goes through all of the layers of the skin and into the tissue that is right under the skin. Some lacerations heal on their own. Others need to be closed with stitches (sutures), staples, skin adhesive strips, or skin glue. Proper laceration care minimizes the risk of infection and helps the laceration to heal better. How is this treated? If sutures or staples were used:  Keep the wound clean and dry.  If you were given a bandage (dressing), you should change it at least one time per day or as told by your health care provider. You should also change it if it becomes wet or dirty.  Keep the wound completely dry for the first 24 hours or as told by your health care provider. After that time, you may shower or bathe. However, make sure that the wound is not soaked in water until after the sutures or staples have been removed.  Clean the wound one time each day or as told by your health care provider: ? Wash the wound with soap and water. ? Rinse the wound with water to remove all soap. ? Pat the wound dry with a clean towel. Do not rub the wound.  After cleaning the wound, apply a thin layer of antibiotic ointmentas told by your health care provider. This will help to prevent infection and keep the dressing from sticking to the wound.  Have the sutures or staples removed as told by your health care provider. If skin adhesive strips were used:  Keep the wound clean and dry.  If you were given a bandage (dressing), you should change it at least one time per day or as told by your health care provider. You should also change it if it becomes dirty or wet.  Do not get the skin adhesive strips wet. You may shower or bathe, but be careful to keep the wound dry.  If the wound gets wet, pat it dry with a clean towel. Do not rub the wound.  Skin adhesive strips fall off on their own. You may trim the strips as the wound heals. Do not remove skin  adhesive strips that are still stuck to the wound. They will fall off in time. If skin glue was used:  Try to keep the wound dry, but you may briefly wet it in the shower or bath. Do not soak the wound in water, such as by swimming.  After you have showered or bathed, gently pat the wound dry with a clean towel. Do not rub the wound.  Do not do any activities that will make you sweat heavily until the skin glue has fallen off on its own.  Do not apply liquid, cream, or ointment medicine to the wound while the skin glue is in place. Using those may loosen the film before the wound has healed.  If you were given a bandage (dressing), you should change it at least one time per day or as told by your health care provider. You should also change it if it becomes dirty or wet.  If a dressing is placed over the wound, be careful not to apply tape directly over the skin glue. Doing that may cause the glue to be pulled off before the wound has healed.  Do not pick at the glue. The skin glue usually remains in place for 5-10 days, then it falls off of the skin. General Instructions  Take over-the-counter and prescription medicines only   as told by your health care provider.  If you were prescribed an antibiotic medicine or ointment, take or apply it as told by your doctor. Do not stop using it even if your condition improves.  To help prevent scarring, make sure to cover your wound with sunscreen whenever you are outside after stitches are removed, after adhesive strips are removed, or when glue remains in place and the wound is healed. Make sure to wear a sunscreen of at least 30 SPF.  Do not scratch or pick at the wound.  Keep all follow-up visits as told by your health care provider. This is important.  Check your wound every day for signs of infection. Watch for: ? Redness, swelling, or pain. ? Fluid, blood, or pus.  Raise (elevate) the injured area above the level of your heart while you  are sitting or lying down, if possible. Contact a health care provider if:  You received a tetanus shot and you have swelling, severe pain, redness, or bleeding at the injection site.  You have a fever.  A wound that was closed breaks open.  You notice a bad smell coming from your wound or your dressing.  You notice something coming out of the wound, such as wood or glass.  Your pain is not controlled with medicine.  You have increased redness, swelling, or pain at the site of your wound.  You have fluid, blood, or pus coming from your wound.  You notice a change in the color of your skin near your wound.  You need to change the dressing frequently due to fluid, blood, or pus draining from the wound.  You develop a new rash.  You develop numbness around the wound. Get help right away if:  You develop severe swelling around the wound.  Your pain suddenly increases and is severe.  You develop painful lumps near the wound or on skin that is anywhere on your body.  You have a red streak going away from your wound.  The wound is on your hand or foot and you cannot properly move a finger or toe.  The wound is on your hand or foot and you notice that your fingers or toes look pale or bluish. This information is not intended to replace advice given to you by your health care provider. Make sure you discuss any questions you have with your health care provider. Document Released: 08/15/2005 Document Revised: 01/15/2016 Document Reviewed: 08/11/2014 Elsevier Interactive Patient Education  2018 [employer]  Wound Care, Adult Taking care of your wound properly can help to prevent pain and infection. It can also help your wound to heal more quickly. How is this treated? Wound care  Follow instructions from your health care provider about how to take care of your wound. Make sure you: ? Wash your hands with soap and water before you change the bandage (dressing). If soap and  water are not available, use hand sanitizer. ? Change your dressing as told by your health care provider. ? Leave stitches (sutures), skin glue, or adhesive strips in place. These skin closures may need to stay in place for 2 weeks or longer. If adhesive strip edges start to loosen and curl up, you may trim the loose edges. Do not remove adhesive strips completely unless your health care provider tells you to do that.  Check your wound area every day for signs of infection. Check for: ? More redness, swelling, or pain. ? More fluid or blood. ? Warmth. ?  Pus or a bad smell.  Ask your health care provider if you should clean the wound with mild soap and water. Doing this may include: ? Using a clean towel to pat the wound dry after cleaning it. Do not rub or scrub the wound. ? Applying a cream or ointment. Do this only as told by your health care provider. ? Covering the incision with a clean dressing.  Ask your health care provider when you can leave the wound uncovered. Medicines   If you were prescribed an antibiotic medicine, cream, or ointment, take or use the antibiotic as told by your health care provider. Do not stop taking or using the antibiotic even if your condition improves.  Take over-the-counter and prescription medicines only as told by your health care provider. If you were prescribed pain medicine, take it at least 30 minutes before doing any wound care or as told by your health care provider. General instructions  Return to your normal activities as told by your health care provider. Ask your health care provider what activities are safe.  Do not scratch or pick at the wound.  Keep all follow-up visits as told by your health care provider. This is important.  Eat a diet that includes protein, vitamin A, vitamin C, and other nutrient-rich foods. These help the wound heal: ? Protein-rich foods include meat, dairy, beans, nuts, and other sources. ? Vitamin A-rich foods  include carrots and dark green, leafy vegetables. ? Vitamin C-rich foods include citrus, tomatoes, and other fruits and vegetables. ? Nutrient-rich foods have protein, carbohydrates, fat, vitamins, or minerals. Eat a variety of healthy foods including vegetables, fruits, and whole grains. Contact a health care provider if:  You received a tetanus shot and you have swelling, severe pain, redness, or bleeding at the injection site.  Your pain is not controlled with medicine.  You have more redness, swelling, or pain around the wound.  You have more fluid or blood coming from the wound.  Your wound feels warm to the touch.  You have pus or a bad smell coming from the wound.  You have a fever or chills.  You are nauseous or you vomit.  You are dizzy. Get help right away if:  You have a red streak going away from your wound.  The edges of the wound open up and separate.  Your wound is bleeding and the bleeding does not stop with gentle pressure.  You have a rash.  You faint.  You have trouble breathing. This information is not intended to replace advice given to you by your health care provider. Make sure you discuss any questions you have with your health care provider. Document Released: 05/24/2008 Document Revised: 04/13/2016 Document Reviewed: 03/01/2016 Elsevier Interactive Patient Education  2017 [employer]. Tdap Vaccine (Tetanus, Diphtheria and Pertussis): What You Need to Know 1. Why get vaccinated? Tetanus, diphtheria and pertussis are very serious diseases. Tdap vaccine can protect [place] from these diseases. And, Tdap vaccine given to pregnant women can protect newborn babies against pertussis. TETANUS (Lockjaw) is rare in the [place] today. It causes painful muscle tightening and stiffness, usually all over the body.  It can lead to tightening of muscles in the head and neck so you can't open your mouth, swallow, or sometimes even breathe. Tetanus kills about 1  out of 10 people who are infected even after receiving the best medical care.  DIPHTHERIA is also rare in the [place] today. It can cause a thick  coating to form in the back of the throat.  It can lead to breathing problems, heart failure, paralysis, and death.  PERTUSSIS (Whooping Cough) causes severe coughing spells, which can cause difficulty breathing, vomiting and disturbed sleep.  It can also lead to weight loss, incontinence, and rib fractures. Up to 2 in 100 adolescents and 5 in 100 adults with pertussis are hospitalized or have complications, which could include pneumonia or death.  These diseases are caused by bacteria. Diphtheria and pertussis are spread from person to person through secretions from coughing or sneezing. Tetanus enters the body through cuts, scratches, or wounds. Before vaccines, as many as 200,000 cases of diphtheria, 200,000 cases of pertussis, and hundreds of cases of tetanus, were reported in the [place] each year. Since vaccination began, reports of cases for tetanus and diphtheria have dropped by about 99% and for pertussis by about 80%. 2. Tdap vaccine Tdap vaccine can protect adolescents and adults from tetanus, diphtheria, and pertussis. One dose of Tdap is routinely given at age 66 or 66. People who did not get Tdap at that age should get it as soon as possible. Tdap is especially important for healthcare professionals and anyone having close contact with a baby younger than 12 months. Pregnant women should get a dose of Tdap during every pregnancy, to protect the newborn from pertussis. Infants are most at risk for severe, life-threatening complications from pertussis. Another vaccine, called Td, protects against tetanus and diphtheria, but not pertussis. A Td booster should be given every 10 years. Tdap may be given as one of these boosters if you have never gotten Tdap before. Tdap may also be given after a severe cut or burn to prevent tetanus  infection. Your doctor or the person giving you the vaccine can give you more information. Tdap may safely be given at the same time as other vaccines. 3. Some people should not get this vaccine  A person who has ever had a life-threatening allergic reaction after a previous dose of any diphtheria, tetanus or pertussis containing vaccine, OR has a severe allergy to any part of this vaccine, should not get Tdap vaccine. Tell the person giving the vaccine about any severe allergies.  Anyone who had coma or long repeated seizures within 7 days after a childhood dose of DTP or DTaP, or a previous dose of Tdap, should not get Tdap, unless a cause other than the vaccine was found. They can still get Td.  Talk to your doctor if you: ? have seizures or another nervous system problem, ? had severe pain or swelling after any vaccine containing diphtheria, tetanus or pertussis, ? ever had a condition called Guillain-Barr Syndrome (GBS), ? aren't feeling well on the day the shot is scheduled. 4. Risks With any medicine, including vaccines, there is a chance of side effects. These are usually mild and go away on their own. Serious reactions are also possible but are rare. Most people who get Tdap vaccine do not have any problems with it. Mild problems following Tdap: (Did not interfere with activities)  Pain where the shot was given (about 3 in 4 adolescents or 2 in 3 adults)  Redness or swelling where the shot was given (about 1 person in 5)  Mild fever of at least 100.50F (up to about 1 in 25 adolescents or 1 in 100 adults)  Headache (about 3 or 4 people in 10)  Tiredness (about 1 person in 3 or 4)  Nausea, vomiting, diarrhea, stomach ache (  up to 1 in 4 adolescents or 1 in 10 adults)  Chills, sore joints (about 1 person in 10)  Body aches (about 1 person in 3 or 4)  Rash, swollen glands (uncommon)  Moderate problems following Tdap: (Interfered with activities, but did not require medical  attention)  Pain where the shot was given (up to 1 in 5 or 6)  Redness or swelling where the shot was given (up to about 1 in 16 adolescents or 1 in 12 adults)  Fever over 102F (about 1 in 100 adolescents or 1 in 250 adults)  Headache (about 1 in 7 adolescents or 1 in 10 adults)  Nausea, vomiting, diarrhea, stomach ache (up to 1 or 3 people in 100)  Swelling of the entire arm where the shot was given (up to about 1 in 500).  Severe problems following Tdap: (Unable to perform usual activities; required medical attention)  Swelling, severe pain, bleeding and redness in the arm where the shot was given (rare).  Problems that could happen after any vaccine:  People sometimes faint after a medical procedure, including vaccination. Sitting or lying down for about 15 minutes can help prevent fainting, and injuries caused by a fall. Tell your doctor if you feel dizzy, or have vision changes or ringing in the ears.  Some people get severe pain in the shoulder and have difficulty moving the arm where a shot was given. This happens very rarely.  Any medication can cause a severe allergic reaction. Such reactions from a vaccine are very rare, estimated at fewer than 1 in a million doses, and would happen within a few minutes to a few hours after the vaccination. As with any medicine, there is a very remote chance of a vaccine causing a serious injury or death. The safety of vaccines is always being monitored. For more information, visit: http://floyd.org/www.cdc.gov/vaccinesafety/ 5. What if there is a serious problem? What should I look for? Look for anything that concerns you, such as signs of a severe allergic reaction, very high fever, or unusual behavior. Signs of a severe allergic reaction can include hives, swelling of the face and throat, difficulty breathing, a fast heartbeat, dizziness, and weakness. These would usually start a few minutes to a few hours after the vaccination. What should I do?  If  you think it is a severe allergic reaction or other emergency that can't wait, call 9-1-1 or get the person to the nearest hospital. Otherwise, call your doctor.  Afterward, the reaction should be reported to the Vaccine Adverse Event Reporting System (VAERS). Your doctor might file this report, or you can do it yourself through the VAERS web site at www.vaers.LAgents.nohhs.gov, or by calling 1-3042315277. ? VAERS does not give medical advice. 6. The National Vaccine Injury Compensation Program The [employer] Vaccine Injury Compensation Program (VICP) is a federal program that was created to compensate people who may have been injured by certain vaccines. Persons who believe they may have been injured by a vaccine can learn about the program and about filing a claim by calling 1-(832)060-6061 or visiting the VICP website at SpiritualWord.atwww.hrsa.gov/vaccinecompensation. There is a time limit to file a claim for compensation. 7. How can I learn more?  Ask your doctor. He or she can give you the vaccine package insert or suggest other sources of information.  Call your local or state health department.  Contact the Centers for Disease Control and Prevention (CDC): ? Call (561)368-13951-6703297270 (1-800-CDC-INFO) or ? Visit CDC's website at PicCapture.uywww.cdc.gov/vaccines CDC Tdap  Vaccine VIS (10/22/13) This information is not intended to replace advice given to you by your health care provider. Make sure you discuss any questions you have with your health care provider. Document Released: 02/14/2012 Document Revised: 05/05/2016 Document Reviewed: 05/05/2016 Elsevier Interactive Patient Education  2017 [employer].

## 2018-01-05 ENCOUNTER — Other Ambulatory Visit: Payer: Self-pay | Admitting: Neurosurgery

## 2018-01-05 DIAGNOSIS — M47012 Anterior spinal artery compression syndromes, cervical region: Secondary | ICD-10-CM

## 2018-01-10 ENCOUNTER — Ambulatory Visit
Admission: RE | Admit: 2018-01-10 | Discharge: 2018-01-10 | Disposition: A | Discharge status: Home / Self Care | Payer: Medicare Other | Source: Ambulatory Visit | Attending: Neurosurgery | Admitting: Neurosurgery

## 2018-01-10 DIAGNOSIS — M47012 Anterior spinal artery compression syndromes, cervical region: Secondary | ICD-10-CM

## 2018-02-01 ENCOUNTER — Institutional Professional Consult (permissible substitution): Payer: Medicare Other | Admitting: Pulmonary Disease

## 2018-03-12 ENCOUNTER — Ambulatory Visit (INDEPENDENT_AMBULATORY_CARE_PROVIDER_SITE_OTHER)
Admission: RE | Admit: 2018-03-12 | Discharge: 2018-03-12 | Disposition: A | Discharge status: Home / Self Care | Payer: Medicare Other | Source: Ambulatory Visit | Attending: Pulmonary Disease | Admitting: Pulmonary Disease

## 2018-03-12 ENCOUNTER — Other Ambulatory Visit (INDEPENDENT_AMBULATORY_CARE_PROVIDER_SITE_OTHER): Payer: Medicare Other

## 2018-03-12 ENCOUNTER — Encounter: Payer: Self-pay | Admitting: Pulmonary Disease

## 2018-03-12 ENCOUNTER — Ambulatory Visit: Payer: Medicare Other | Admitting: Pulmonary Disease

## 2018-03-12 ENCOUNTER — Telehealth: Payer: Self-pay | Admitting: Pulmonary Disease

## 2018-03-12 VITALS — BP 110/76 | HR 83 | Ht 64.0 in | Wt 180.0 lb

## 2018-03-12 DIAGNOSIS — R0602 Shortness of breath: Secondary | ICD-10-CM | POA: Diagnosis not present

## 2018-03-12 LAB — BRAIN NATRIURETIC PEPTIDE: PRO B NATRI PEPTIDE: 22 pg/mL (ref 0.0–100.0)

## 2018-03-12 LAB — COMPREHENSIVE METABOLIC PANEL
ALK PHOS: 114 U/L (ref 39–117)
ALT: 13 U/L (ref 0–35)
AST: 14 U/L (ref 0–37)
Albumin: 4.1 g/dL (ref 3.5–5.2)
BUN: 15 mg/dL (ref 6–23)
CHLORIDE: 106 meq/L (ref 96–112)
CO2: 27 mEq/L (ref 19–32)
Calcium: 9.4 mg/dL (ref 8.4–10.5)
Creatinine, Ser: 0.88 mg/dL (ref 0.40–1.20)
GFR: 66.2 mL/min (ref >60.00)
GLUCOSE: 118 mg/dL — AB (ref 70–99)
POTASSIUM: 3.8 meq/L (ref 3.5–5.1)
SODIUM: 141 meq/L (ref 135–145)
TOTAL PROTEIN: 6.6 g/dL (ref 6.0–8.3)
Total Bilirubin: 0.7 mg/dL (ref 0.2–1.2)

## 2018-03-12 NOTE — Patient Instructions (Signed)
Patient with shortness of breath worsening over the last couple of months Associated leg swelling, worsening shortness of breath when laying down Denies history of heart disease  Concern with new onset heart disease Does not have underlying lung disease, low risk of developing lung disease Adequately treated obstructive sleep apnea Was not informed of any abnormality on a recent primary function study  We will obtain an echocardiogram  Review recent pulmonary function test  Obtain a BMP  Obtain chest x-ray  Obtain Chem-7  We will see patient back in the office in about 4 to 6 weeks  Will call patient if any abnormal results come through

## 2018-03-12 NOTE — Progress Notes (Signed)
[name]    [id number]    06/21/1941  Primary Care Physician:Ehinger, Molly Maduro, MD  Referring Physician: Blair Heys, MD [address] [place], [place] [address]  Chief complaint: Shortness of breath  HPI:  Shortness of breath few months duration She is short of breath for about a couple of blocks walking Increasing shortness of breath when laying down Associated leg swelling Associated chest congestion when laying down Denies any cough, has not been acutely ill Denies any history of heart disease, no history of hypertension  She does have a history of adequately treated obstructive sleep apnea, compliant with treatment, recently restudied about 4 months ago-follows up regularly  History of arthritis, bilateral knee replacements-she did not rebound completely from her knee surgery last year although she noted that she did improve to be in stable state up until a couple of months ago when her shortness of breath started worsening  Describes gaining a few pounds-she is unsure of how many pounds specifically   Pets: She does have cats-does not affect her breathing  occupation: No pertinent occupational history Exposures: No occupational exposures Smoking history: Never smoker  travel history: No recent travel Relevant family history: Denies a family history of heart disease   Outpatient Encounter Medications as of 03/12/2018  Medication Sig  . albuterol (PROVENTIL HFA;VENTOLIN HFA) 108 (90 Base) MCG/ACT inhaler Inhale 2 puffs every 6 (six) hours as needed into the lungs for wheezing or shortness of breath.   . co-enzyme Q-10 30 MG capsule Take 30 mg by mouth daily.  Marland Kitchen ibuprofen (ADVIL,MOTRIN) 200 MG tablet Take 200 mg by mouth every 6 (six) hours as needed.  Marland Kitchen levothyroxine (SYNTHROID, LEVOTHROID) 75 MCG tablet Take 75 mcg by mouth daily before breakfast.   . methocarbamol (ROBAXIN) 500 MG tablet Take 1 tablet (500 mg total) by mouth every 8  (eight) hours as needed for muscle spasms.  . Multiple Vitamins-Minerals (MULTIVITAMIN WITH MINERALS) tablet Take 1 tablet by mouth daily.  . [DISCONTINUED] oxyCODONE (OXY IR/ROXICODONE) 5 MG immediate release tablet Take 1-2 tablets (5-10 mg total) by mouth every 8 (eight) hours as needed for moderate pain or severe pain. (Patient not taking: Reported on 08/14/2017)  . [DISCONTINUED] rivaroxaban (XARELTO) 10 MG TABS tablet Take 1 tablet (10 mg total) by mouth at bedtime. (Patient not taking: Reported on 08/11/2017)   No facility-administered encounter medications on file as of 03/12/2018.     Allergies as of 03/12/2018 - Review Complete 03/12/2018  Allergen Reaction Noted  . Penicillins Hives 05/22/2012    Past Medical History:  Diagnosis Date  . Acid reflux disease   . Arthritis   . Atypical chest pain   . Back pain   . Depression   . Gastroparesis   . H/O: hysterectomy   . Hiatal hernia   . Hypothyroidism   . Neck pain   . Self inflicted injury   . Sleep apnea     Past Surgical History:  Procedure Laterality Date  . ABDOMINAL HYSTERECTOMY    . COLONOSCOPY W/ BIOPSIES AND POLYPECTOMY    . NECK SURGERY     Metal plate in neck for degenerative disc disease  . TOTAL KNEE ARTHROPLASTY Right 02/28/2017   Procedure: RIGHT TOTAL KNEE ARTHROPLASTY;  Surgeon: [name], MD;  Location: [facility] OR;  Service: Orthopedics;  Laterality: Right;  . TOTAL KNEE ARTHROPLASTY Left 07/18/2017   Procedure: LEFT TOTAL KNEE ARTHROPLASTY;  Surgeon: [name],  MD;  Location: [facility] OR;  Service: Orthopedics;  Laterality: Left;    Family History  Problem Relation Age of Onset  . Alzheimer's disease Mother   . Renal Disease Father     Social History   Socioeconomic History  . Marital status: Divorced    Spouse name: Not on file  . Number of children: 2  . Years of education: Not on file  . Highest education level: Not on file  Occupational History  . Not on file  Social Needs  .  Financial resource strain: Not on file  . Food insecurity:    Worry: Not on file    Inability: Not on file  . Transportation needs:    Medical: Not on file    Non-medical: Not on file  Tobacco Use  . Smoking status: Never Smoker  . Smokeless tobacco: Never Used  Substance and Sexual Activity  . Alcohol use: No  . Drug use: No  . Sexual activity: Not on file  Lifestyle  . Physical activity:    Days per week: Not on file    Minutes per session: Not on file  . Stress: Not on file  Relationships  . Social connections:    Talks on phone: Not on file    Gets together: Not on file    Attends religious service: Not on file    Active member of club or organization: Not on file    Attends meetings of clubs or organizations: Not on file    Relationship status: Not on file  . Intimate partner violence:    Fear of current or ex partner: Not on file    Emotionally abused: Not on file    Physically abused: Not on file    Forced sexual activity: Not on file  Other Topics Concern  . Not on file  Social History Narrative  . Not on file    Review of systems: Review of Systems  Constitutional: Negative for fever and chills.  HENT: Negative.   Eyes: Negative for blurred vision.  Respiratory: as per HPI , shortness of breath on exertion, orthopnea  Denies any acute respiratory illness cardiovascular: Negative for chest pain and palpitations.  Gastrointestinal: Negative for vomiting, diarrhea, blood per rectum. Genitourinary: Negative for dysuria, urgency, frequency and hematuria.  Musculoskeletal: Admits to back pain, knee pain, occasional myalgia Skin: Negative for itching and rash.  Neurological: Negative for dizziness, tremors, focal weakness, seizures and loss of consciousness.  Endo/Heme/Allergies: Negative for environmental allergies.  Psychiatric/Behavioral: Negative for depression, suicidal ideas and hallucinations.  All other systems reviewed and are negative.  Physical  Exam: Blood pressure 110/76, pulse 83, height 5\' 4"  (1.626 m), weight 180 lb (81.6 kg), SpO2 96 %. Gen:      No acute distress HEENT:  EOMI, sclera anicteric  Neck:     No masses; no thyromegaly, no JVD Lungs:    Clear to auscultation bilaterally; normal respiratory effort, no rales CV:         Regular rate and rhythm; no murmurs Abd:      Bowel sounds; soft, non-tender; no palpable masses, no distension Ext:    Mild edema; adequate peripheral perfusion Skin:      Warm and dry; no rash Neuro: alert and oriented x 3 Psych: normal mood and affect  Data Reviewed: Pulmonary function studies not available-patient reports she was not called about any abnormal study-awaiting study results Echo from 2007 was within normal limits Sleep study results not available-follows up regularly  with a sleep physician  Assessment:  1.  Shortness of breath 2.  Orthopnea 3.  Arthralgia 4.  Bilateral peripheral edema 5.  Hypothyroidism-follows up regularly, states well controlled 6.  Cervical spondylosis 7.  I think this just came up so must have done something different  No underlying lung disease, no significant risk profile No underlying heart disease , leg swelling, orthopnea, history of obstructive sleep apnea  Symptoms are concerning for possible onset of heart failure She was prescribed albuterol in the past-she was prescribed albuterol relating to a cough, denies any wheezing, has not really noticed any significant improvement with use of albuterol which she used as needed   Plan/Recommendations: Obtain an echocardiogram to assess cardiac function Obtain Chem-7 to assess a BUN/creatinine, rule out hyponatremia Obtain BNP Chest x-ray  Will review results of pulmonary function study as the become available  Encouraged to follow-up with sleep physician  We will see patient back in about 4 to 6 weeks, encouraged to call if any significant concerns   Virl Diamond MD [facility] 03/12/2018, 12:22 PM  CC: Blair Heys, MD

## 2018-03-13 NOTE — Telephone Encounter (Signed)
Left a message for the patient regarding normal PFT [facility]

## 2018-03-15 ENCOUNTER — Ambulatory Visit (HOSPITAL_COMMUNITY): Payer: Medicare Other | Attending: Cardiovascular Disease

## 2018-03-15 ENCOUNTER — Other Ambulatory Visit: Payer: Self-pay

## 2018-03-15 DIAGNOSIS — R0602 Shortness of breath: Secondary | ICD-10-CM | POA: Insufficient documentation

## 2018-03-23 ENCOUNTER — Telehealth (INDEPENDENT_AMBULATORY_CARE_PROVIDER_SITE_OTHER): Payer: Self-pay | Admitting: Orthopaedic Surgery

## 2018-03-23 NOTE — Telephone Encounter (Signed)
I called patient, patient is going to drop off handicap placard ASAP.

## 2018-03-23 NOTE — Telephone Encounter (Signed)
Patient left a voicemail stating that she has to renew her handicap sticker for her car by the end of July.  Patient is requesting a return call when she can stop by the office and pick up the form.

## 2018-04-11 ENCOUNTER — Encounter: Payer: Self-pay | Admitting: Pulmonary Disease

## 2018-04-11 ENCOUNTER — Ambulatory Visit: Payer: Medicare Other | Admitting: Pulmonary Disease

## 2018-04-11 VITALS — BP 120/86 | HR 65 | Ht 64.5 in | Wt 183.0 lb

## 2018-04-11 DIAGNOSIS — R0602 Shortness of breath: Secondary | ICD-10-CM | POA: Insufficient documentation

## 2018-04-11 NOTE — Patient Instructions (Signed)
Shortness of breath  Echocardiogram with relaxation abnormalities   Encouraged to continue regular exercises--this will help symptoms  Consider following up with GI to evaluate hiatal hernia  We will obtain download from your CPAP machine  I will see you back in the office in 3 months

## 2018-04-11 NOTE — Progress Notes (Signed)
[name]    [id number]    Oct 25, 1940  Primary Care Physician:Ehinger, Molly Maduroobert, MD  Referring Physician: Blair HeysEhinger, RobertMarcello Moores, MD [address] [place], [place] [address]    Shortness of breath few months duration Symptoms have not really changed significantly since last time she was here She still gets short of breath with activity She gets in the pool on a regular basis to exercise, tries to stay active She does not feel she is more short of breath on the last time she was here She started wearing support stockings which have helped the edema in her leg Does not feel acutely ill Has not been coughing  Denies any history of heart disease, no history of hypertension  She does have a history of adequately treated obstructive sleep apnea, compliant with treatment, recently restudied about 4 months ago-follows up regularly  History of arthritis, bilateral knee replacements-she did not rebound completely from her knee surgery last year   Describes gaining a few pounds-she is unsure of how many pounds specifically Smoking history: Never smoker  travel history: No recent travel Relevant family history: Denies a family history of heart disease   Outpatient Encounter Medications as of 04/11/2018  Medication Sig  . albuterol (PROVENTIL HFA;VENTOLIN HFA) 108 (90 Base) MCG/ACT inhaler Inhale 2 puffs every 6 (six) hours as needed into the lungs for wheezing or shortness of breath.   . co-enzyme Q-10 30 MG capsule Take 30 mg by mouth daily.  Marland Kitchen. ibuprofen (ADVIL,MOTRIN) 200 MG tablet Take 200 mg by mouth every 6 (six) hours as needed.  Marland Kitchen. levothyroxine (SYNTHROID, LEVOTHROID) 75 MCG tablet Take 75 mcg by mouth daily before breakfast.   . Multiple Vitamins-Minerals (MULTIVITAMIN WITH MINERALS) tablet Take 1 tablet by mouth daily.  . [DISCONTINUED] methocarbamol (ROBAXIN) 500 MG tablet Take 1 tablet (500 mg total) by mouth every 8 (eight) hours as needed for muscle spasms.     No facility-administered encounter medications on file as of 04/11/2018.     Allergies as of 04/11/2018 - Review Complete 04/11/2018  Allergen Reaction Noted  . Penicillins Hives 05/22/2012    Past Medical History:  Diagnosis Date  . Acid reflux disease   . Arthritis   . Atypical chest pain   . Back pain   . Depression   . Gastroparesis   . H/O: hysterectomy   . Hiatal hernia   . Hypothyroidism   . Neck pain   . Self inflicted injury   . Sleep apnea     Past Surgical History:  Procedure Laterality Date  . ABDOMINAL HYSTERECTOMY    . COLONOSCOPY W/ BIOPSIES AND POLYPECTOMY    . NECK SURGERY     Metal plate in neck for degenerative disc disease  . TOTAL KNEE ARTHROPLASTY Right 02/28/2017   Procedure: RIGHT TOTAL KNEE ARTHROPLASTY;  Surgeon: Valeria BatmanWhitfield, Peter W, MD;  [facility];  Service: Orthopedics;  Laterality: Right;  . TOTAL KNEE ARTHROPLASTY Left 07/18/2017   Procedure: LEFT TOTAL KNEE ARTHROPLASTY;  Surgeon: Valeria BatmanWhitfield, Peter W, MD;  Location: [facility] OR;  Service: Orthopedics;  Laterality: Left;    Family History  Problem Relation Age of Onset  . Alzheimer's disease Mother   . Renal Disease Father    Review of systems: Review of Systems  Constitutional: Negative for fever and chills.  Respiratory: as per HPI , shortness of breath on exertion, orthopnea  cardiovascular: Negative for chest pain and palpitations.  Musculoskeletal: Admits to  back pain, knee pain, occasional myalgia All other systems reviewed and are negative.  Physical Exam: Blood pressure 110/76, pulse 83, height 5\' 4"  (1.626 m), weight 180 lb (81.6 kg), SpO2 96 %. Gen:      No acute distress HEENT:  EOMI, sclera anicteric  Neck:     No masses; no thyromegaly, no JVD Lungs:    Clear to auscultation bilaterally; normal respiratory effort, no rales CV:         Regular rate and rhythm; no murmurs Abd:      Bowel sounds; soft, non-tender; no palpable masses, no distension Ext:    Mild edema;  adequate peripheral perfusion Skin:      Warm and dry; no rash Neuro: alert and oriented x 3 Psych: normal mood and affect  Data Reviewed: Spirometry previously did not reveal any significant obstructive disease Recent BNP was normal Echocardiogram did reveal diastolic dysfunction  Assessment:  1.  Shortness of breath  2.  Orthopnea-this has improved  3.  Arthralgia-persistent, multifactorial, history of bilateral knee surgery  4.  Bilateral peripheral edema-improved, wears a support stocking at present  5.  Hypothyroidism-follows up regularly, states well controlled  6.  Cervical spondylosis  No underlying lung disease, no significant risk profile  Diastolic dysfunction but BNP within normal limits  Plan/Recommendations: We will get a download from a CPAP machine  Encouraged to continue staying active  Encouraged to follow-up with sleep physician  We will see her back in the office in about 3 months   Virl DiamondAdewale Sascha Palma MD [facility] 04/11/2018, 11:00 AM  CC: Blair HeysEhinger, Robert, MD

## 2018-04-16 ENCOUNTER — Ambulatory Visit (INDEPENDENT_AMBULATORY_CARE_PROVIDER_SITE_OTHER): Payer: Medicare Other | Admitting: Orthopaedic Surgery

## 2018-08-24 ENCOUNTER — Telehealth: Payer: Self-pay | Admitting: Pulmonary Disease

## 2018-08-24 NOTE — Telephone Encounter (Signed)
Called and left a message I am needing the patients DME company or her to bring her SD-card for DL at apt on 16/05/9611/30/19.Will call back

## 2018-08-27 ENCOUNTER — Ambulatory Visit (INDEPENDENT_AMBULATORY_CARE_PROVIDER_SITE_OTHER): Payer: Medicare Other | Admitting: Pulmonary Disease

## 2018-08-27 ENCOUNTER — Encounter: Payer: Self-pay | Admitting: Pulmonary Disease

## 2018-08-27 DIAGNOSIS — M542 Cervicalgia: Secondary | ICD-10-CM

## 2018-08-27 DIAGNOSIS — R0602 Shortness of breath: Secondary | ICD-10-CM

## 2018-08-27 NOTE — Progress Notes (Signed)
Kristen MooresBarbara E Leonard    [id number]    10-Oct-1940  Primary Care Physician:Ehinger, Molly Maduroobert, MD  Referring Physician: Blair HeysEhinger, Robert, MD [address] [place], [place] [address]  She is in today for follow-up visit History of obstructive sleep apnea-on CPAP therapy History of chronic neck pain  Shortness of breath few months duration Symptoms have not really changed significantly since last time she was here She still gets short of breath with activity She gets in the pool on a regular basis to exercise, tries to stay active She started wearing support stockings which have helped the edema in her leg Does not feel acutely ill Has not been coughing  Denies any history of heart disease, no history of hypertension  She does have a history of adequately treated obstructive sleep apnea, compliant with treatment, recently restudied about 4 months ago-follows up regularly  History of arthritis, bilateral knee replacements-she did not rebound completely from her knee surgery last year   Describes gaining a few pounds-she is unsure of how many pounds specifically Smoking history: Never smoker  travel history: No recent travel Relevant family history: Denies a family history of heart disease   Outpatient Encounter Medications as of 08/27/2018  Medication Sig  . albuterol (PROVENTIL HFA;VENTOLIN HFA) 108 (90 Base) MCG/ACT inhaler Inhale 2 puffs every 6 (six) hours as needed into the lungs for wheezing or shortness of breath.   . co-enzyme Q-10 30 MG capsule Take 30 mg by mouth daily.  Marland Kitchen. ibuprofen (ADVIL,MOTRIN) 200 MG tablet Take 200 mg by mouth every 6 (six) hours as needed.  Marland Kitchen. levothyroxine (SYNTHROID, LEVOTHROID) 75 MCG tablet Take 75 mcg by mouth daily before breakfast.   . Multiple Vitamins-Minerals (MULTIVITAMIN WITH MINERALS) tablet Take 1 tablet by mouth daily.   No facility-administered encounter medications on file as of 08/27/2018.     Allergies as of  08/27/2018 - Review Complete 04/11/2018  Allergen Reaction Noted  . Penicillins Hives 05/22/2012    Past Medical History:  Diagnosis Date  . Acid reflux disease   . Arthritis   . Atypical chest pain   . Back pain   . Depression   . Gastroparesis   . H/O: hysterectomy   . Hiatal hernia   . Hypothyroidism   . Neck pain   . Self inflicted injury   . Sleep apnea     Past Surgical History:  Procedure Laterality Date  . ABDOMINAL HYSTERECTOMY    . COLONOSCOPY W/ BIOPSIES AND POLYPECTOMY    . NECK SURGERY     Metal plate in neck for degenerative disc disease  . TOTAL KNEE ARTHROPLASTY Right 02/28/2017   Procedure: RIGHT TOTAL KNEE ARTHROPLASTY;  Surgeon: Valeria BatmanWhitfield, Peter W, MD;  [facility];  Service: Orthopedics;  Laterality: Right;  . TOTAL KNEE ARTHROPLASTY Left 07/18/2017   Procedure: LEFT TOTAL KNEE ARTHROPLASTY;  Surgeon: Valeria BatmanWhitfield, Peter W, MD;  Location: [facility] OR;  Service: Orthopedics;  Laterality: Left;    Family History  Problem Relation Age of Onset  . Alzheimer's disease Mother   . Renal Disease Father    Review of systems: Review of Systems  Constitutional: Negative for fever and chills.  Neck: Neck pain and discomfort Respiratory: as per HPI , shortness of breath on exertion, orthopnea  cardiovascular: Negative for chest pain and palpitations.  Musculoskeletal: Admits to back pain, knee pain, occasional myalgia All other systems reviewed and are negative.  Physical Exam: Blood pressure 110/76, pulse  83, height 5\' 4"  (1.626 m), weight 180 lb (81.6 kg), SpO2 96 %. Gen:      No acute distress HEENT:  EOMI, sclera anicteric   Neck:     No masses; no thyromegaly, no JVD Lungs:    Clear to auscultation bilaterally; normal respiratory effort, no rales CV:         Regular rate and rhythm; no murmurs Neuro: alert and oriented x 3 Psych: normal mood and affect  Data Reviewed: Spirometry previously did not reveal any significant obstructive disease Recent BNP  was normal Echocardiogram did reveal diastolic dysfunction  Assessment:  1.  Shortness of breath  2.  Orthopnea-this has improved  3.  Arthralgia-persistent, multifactorial, history of bilateral knee surgery  4.  Bilateral peripheral edema-improved, wears a support stocking at present  5.  Hypothyroidism-follows up regularly, states well controlled  6.  Cervical spondylosis -She is having significant neck pain and discomfort  7.  Obstructive sleep apnea -Compliant with CPAP use  No underlying lung disease, no significant risk profile  Diastolic dysfunction but BNP within normal limits  Plan/Recommendations: We will get a download from a CPAP machine -This is showing 90% compliance with a residual AHI 1.1  Encouraged to continue staying active  We will order a CT scan of the chest and neck-describing severe neck pain, keeping him from sleeping at night-history of cervical spondylosis for which she had a plate placed in the past  We will see her back in the office in about 3 months   Virl DiamondAdewale Brain Honeycutt MD  [facility] 08/27/2018, 4:18 PM  CC: Blair HeysEhinger, Robert, MD

## 2018-08-27 NOTE — Addendum Note (Signed)
Addended by: Sylvester HarderMOORE, Yeira Gulden R on: 08/27/2018 04:55 PM   Modules accepted: Orders

## 2018-08-27 NOTE — Patient Instructions (Signed)
Significant neck pain and discomfort Shortness of breath  We will obtain a CT scan of her neck and chest Follow-up with pulmonary function study-1 was already ordered  I will see you back in about 3 months We will call you to update you about CT results  Continue with CPAP use

## 2018-08-28 NOTE — Telephone Encounter (Signed)
Patient seen in office

## 2018-08-30 ENCOUNTER — Other Ambulatory Visit (INDEPENDENT_AMBULATORY_CARE_PROVIDER_SITE_OTHER): Payer: Medicare Other

## 2018-08-30 DIAGNOSIS — R0602 Shortness of breath: Secondary | ICD-10-CM

## 2018-08-30 LAB — BASIC METABOLIC PANEL
BUN: 14 mg/dL (ref 6–23)
CHLORIDE: 104 meq/L (ref 96–112)
CO2: 28 meq/L (ref 19–32)
Calcium: 9.4 mg/dL (ref 8.4–10.5)
Creatinine, Ser: 0.94 mg/dL (ref 0.40–1.20)
GFR: 61.27 mL/min (ref >60.00)
Glucose, Bld: 108 mg/dL — ABNORMAL HIGH (ref 70–99)
Potassium: 3.8 mEq/L (ref 3.5–5.1)
SODIUM: 139 meq/L (ref 135–145)

## 2018-09-03 ENCOUNTER — Other Ambulatory Visit: Payer: Medicare Other

## 2018-09-03 ENCOUNTER — Inpatient Hospital Stay: Admission: RE | Admit: 2018-09-03 | Payer: Medicare Other | Source: Ambulatory Visit

## 2018-09-10 ENCOUNTER — Ambulatory Visit (INDEPENDENT_AMBULATORY_CARE_PROVIDER_SITE_OTHER): Payer: Medicare Other | Admitting: Orthopaedic Surgery

## 2018-09-13 ENCOUNTER — Encounter (INDEPENDENT_AMBULATORY_CARE_PROVIDER_SITE_OTHER): Payer: Self-pay | Admitting: Orthopaedic Surgery

## 2018-09-13 ENCOUNTER — Ambulatory Visit (INDEPENDENT_AMBULATORY_CARE_PROVIDER_SITE_OTHER): Payer: Medicare Other | Admitting: Orthopaedic Surgery

## 2018-09-13 VITALS — BP 139/85 | HR 74

## 2018-09-13 DIAGNOSIS — Z96652 Presence of left artificial knee joint: Secondary | ICD-10-CM | POA: Diagnosis not present

## 2018-09-13 NOTE — Progress Notes (Signed)
Office Visit Note   Patient: [name]           Date of Birth: Feb 16, 1941           MRN: [id number] Visit Date: 09/13/2018              Requested by: Blair Heys, MD [address] [place], [place] [address] PCP: Blair Heys, MD   Assessment & Plan: Visit Diagnoses:  1. History of left knee replacement     Plan: Over a year status post primary left total knee replacement and very happy with the results.  No issues.  No pain.  Have urged her to continue with her exercises and plan to return to the office as needed.. Also discussed need for antibiotics with any invasive procedures  Follow-Up Instructions: Return if symptoms worsen or fail to improve.   Orders:  No orders of the defined types were placed in this encounter.  No orders of the defined types were placed in this encounter.     Procedures: No procedures performed   Clinical Data: No additional findings.   Subjective: Chief Complaint  Patient presents with  . Left Knee - Follow-up  Over a year status post primary left total knee replacement and doing very well.  Very happy the results.  Not having any pain.  Does not use any ambulatory aid.  Surgery performed November 2018 HPI  Review of Systems   Objective: Vital Signs: BP 139/85   Pulse 74   Physical Exam Constitutional:      Appearance: She is well-developed.  Eyes:     Pupils: Pupils are equal, round, and reactive to light.  Pulmonary:     Effort: Pulmonary effort is normal.  Skin:    General: Skin is warm and dry.  Neurological:     Mental Status: She is alert and oriented to person, place, and time.  Psychiatric:        Behavior: Behavior normal.     Ortho Exam awake alert and oriented x3.  Comfortable sitting.  Walks without a limp.  Left knee with full extension and flexed over 100 degrees.  No instability.  Slight anterior drawer sign minimal if any effusion.  No calf pain.  Neurologically intact. knee was  not hot warm or red.  Specialty Comments:  No specialty comments available.  Imaging: No results found.   PMFS History: Patient Active Problem List   Diagnosis Date Noted  . Shortness of breath 04/11/2018  . History of left knee replacement 07/18/2017  . S/P TKR (total knee replacement) using cement, right 02/28/2017  . Unilateral primary osteoarthritis, left knee 01/05/2017  . Unilateral primary osteoarthritis, right knee 01/05/2017  . Asthma 01/15/2014  . 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 Atypical chest pain 05/22/2012  . Hypothyroidism 08/12/2008  . Gastro-esophageal reflux disease with esophagitis 06/12/2007   Past Medical History:  Diagnosis Date  . Acid reflux disease   . Arthritis   . Atypical chest pain   . Back pain   . Depression   . Gastroparesis   . H/O: hysterectomy   . Hiatal hernia   . Hypothyroidism   . 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 Neck pain   . Self inflicted injury   . Sleep apnea     Family History  Problem Relation Age of Onset  . Alzheimer's disease Mother   . Renal Disease Father     Past Surgical History:  Procedure Laterality Date  . ABDOMINAL HYSTERECTOMY    . COLONOSCOPY W/ BIOPSIES AND POLYPECTOMY    . NECK SURGERY     Metal plate in neck for degenerative disc disease  . TOTAL KNEE ARTHROPLASTY Right 02/28/2017   Procedure: RIGHT TOTAL KNEE ARTHROPLASTY;  Surgeon: [name], MD;  Location: [facility] OR;  Service: Orthopedics;  Laterality: Right;  . TOTAL KNEE ARTHROPLASTY Left 07/18/2017   Procedure: LEFT TOTAL KNEE ARTHROPLASTY;  Surgeon: [name], MD;  Location: [facility] OR;  Service: Orthopedics;  Laterality: Left;   Social History   Occupational History  . Not on file  Tobacco Use  . Smoking status: Never Smoker  . Smokeless tobacco: Never Used  Substance and Sexual Activity  . Alcohol use: No  . Drug use: No  . Sexual activity: Not on file     [name], MD   Note - This record has been created  using [employer].  Chart creation errors have been sought, but may not always  have been located. Such creation errors do not reflect on  the standard of medical care.

## 2018-09-14 ENCOUNTER — Ambulatory Visit (INDEPENDENT_AMBULATORY_CARE_PROVIDER_SITE_OTHER)
Admission: RE | Admit: 2018-09-14 | Discharge: 2018-09-14 | Disposition: A | Discharge status: Home / Self Care | Payer: Medicare Other | Source: Ambulatory Visit | Attending: Pulmonary Disease | Admitting: Pulmonary Disease

## 2018-09-14 DIAGNOSIS — R0602 Shortness of breath: Secondary | ICD-10-CM

## 2018-09-14 DIAGNOSIS — M542 Cervicalgia: Secondary | ICD-10-CM

## 2018-09-14 MED ORDER — IOPAMIDOL (ISOVUE-300) INJECTION 61%
100.0000 mL | Freq: Once | INTRAVENOUS | Status: AC | PRN
  Administered 2018-09-14: 100 mL via INTRAVENOUS

## 2018-11-26 ENCOUNTER — Ambulatory Visit: Payer: Medicare Other | Admitting: Pulmonary Disease

## 2020-02-10 DIAGNOSIS — M5136 Other intervertebral disc degeneration, lumbar region: Secondary | ICD-10-CM | POA: Diagnosis not present

## 2020-02-10 DIAGNOSIS — M9903 Segmental and somatic dysfunction of lumbar region: Secondary | ICD-10-CM | POA: Diagnosis not present

## 2020-02-10 DIAGNOSIS — M9905 Segmental and somatic dysfunction of pelvic region: Secondary | ICD-10-CM | POA: Diagnosis not present

## 2020-02-10 DIAGNOSIS — M9904 Segmental and somatic dysfunction of sacral region: Secondary | ICD-10-CM | POA: Diagnosis not present

## 2020-02-13 DIAGNOSIS — M9905 Segmental and somatic dysfunction of pelvic region: Secondary | ICD-10-CM | POA: Diagnosis not present

## 2020-02-13 DIAGNOSIS — M5136 Other intervertebral disc degeneration, lumbar region: Secondary | ICD-10-CM | POA: Diagnosis not present

## 2020-02-13 DIAGNOSIS — M9904 Segmental and somatic dysfunction of sacral region: Secondary | ICD-10-CM | POA: Diagnosis not present

## 2020-02-13 DIAGNOSIS — M9903 Segmental and somatic dysfunction of lumbar region: Secondary | ICD-10-CM | POA: Diagnosis not present

## 2020-02-24 DIAGNOSIS — M9904 Segmental and somatic dysfunction of sacral region: Secondary | ICD-10-CM | POA: Diagnosis not present

## 2020-02-24 DIAGNOSIS — M5136 Other intervertebral disc degeneration, lumbar region: Secondary | ICD-10-CM | POA: Diagnosis not present

## 2020-02-24 DIAGNOSIS — M9903 Segmental and somatic dysfunction of lumbar region: Secondary | ICD-10-CM | POA: Diagnosis not present

## 2020-02-24 DIAGNOSIS — M9905 Segmental and somatic dysfunction of pelvic region: Secondary | ICD-10-CM | POA: Diagnosis not present

## 2020-03-09 DIAGNOSIS — M9905 Segmental and somatic dysfunction of pelvic region: Secondary | ICD-10-CM | POA: Diagnosis not present

## 2020-03-09 DIAGNOSIS — M9904 Segmental and somatic dysfunction of sacral region: Secondary | ICD-10-CM | POA: Diagnosis not present

## 2020-03-09 DIAGNOSIS — M5136 Other intervertebral disc degeneration, lumbar region: Secondary | ICD-10-CM | POA: Diagnosis not present

## 2020-03-09 DIAGNOSIS — M9903 Segmental and somatic dysfunction of lumbar region: Secondary | ICD-10-CM | POA: Diagnosis not present

## 2020-03-16 DIAGNOSIS — M9903 Segmental and somatic dysfunction of lumbar region: Secondary | ICD-10-CM | POA: Diagnosis not present

## 2020-03-16 DIAGNOSIS — M5136 Other intervertebral disc degeneration, lumbar region: Secondary | ICD-10-CM | POA: Diagnosis not present

## 2020-03-16 DIAGNOSIS — M9905 Segmental and somatic dysfunction of pelvic region: Secondary | ICD-10-CM | POA: Diagnosis not present

## 2020-03-16 DIAGNOSIS — M9904 Segmental and somatic dysfunction of sacral region: Secondary | ICD-10-CM | POA: Diagnosis not present

## 2020-03-23 DIAGNOSIS — M9904 Segmental and somatic dysfunction of sacral region: Secondary | ICD-10-CM | POA: Diagnosis not present

## 2020-03-23 DIAGNOSIS — M9905 Segmental and somatic dysfunction of pelvic region: Secondary | ICD-10-CM | POA: Diagnosis not present

## 2020-03-23 DIAGNOSIS — M5136 Other intervertebral disc degeneration, lumbar region: Secondary | ICD-10-CM | POA: Diagnosis not present

## 2020-03-23 DIAGNOSIS — M9903 Segmental and somatic dysfunction of lumbar region: Secondary | ICD-10-CM | POA: Diagnosis not present

## 2020-03-30 DIAGNOSIS — J0110 Acute frontal sinusitis, unspecified: Secondary | ICD-10-CM | POA: Diagnosis not present

## 2020-04-06 ENCOUNTER — Other Ambulatory Visit: Payer: Self-pay

## 2020-04-06 ENCOUNTER — Encounter (HOSPITAL_COMMUNITY): Payer: Self-pay

## 2020-04-06 ENCOUNTER — Inpatient Hospital Stay (HOSPITAL_COMMUNITY)
Admission: EM | Admit: 2020-04-06 | Discharge: 2020-04-06 | DRG: 177 | Discharge status: Against Medical Advice | Payer: Medicare PPO | Attending: Internal Medicine | Admitting: Internal Medicine

## 2020-04-06 ENCOUNTER — Emergency Department (HOSPITAL_COMMUNITY): Payer: Medicare PPO

## 2020-04-06 DIAGNOSIS — K2100 Gastro-esophageal reflux disease with esophagitis, without bleeding: Secondary | ICD-10-CM | POA: Diagnosis not present

## 2020-04-06 DIAGNOSIS — J9601 Acute respiratory failure with hypoxia: Secondary | ICD-10-CM | POA: Diagnosis present

## 2020-04-06 DIAGNOSIS — Z90710 Acquired absence of both cervix and uterus: Secondary | ICD-10-CM

## 2020-04-06 DIAGNOSIS — M1990 Unspecified osteoarthritis, unspecified site: Secondary | ICD-10-CM | POA: Diagnosis not present

## 2020-04-06 DIAGNOSIS — J1282 Pneumonia due to coronavirus disease 2019: Secondary | ICD-10-CM | POA: Diagnosis present

## 2020-04-06 DIAGNOSIS — G4730 Sleep apnea, unspecified: Secondary | ICD-10-CM | POA: Diagnosis present

## 2020-04-06 DIAGNOSIS — J189 Pneumonia, unspecified organism: Secondary | ICD-10-CM | POA: Diagnosis not present

## 2020-04-06 DIAGNOSIS — E039 Hypothyroidism, unspecified: Secondary | ICD-10-CM | POA: Diagnosis present

## 2020-04-06 DIAGNOSIS — Z79899 Other long term (current) drug therapy: Secondary | ICD-10-CM

## 2020-04-06 DIAGNOSIS — F329 Major depressive disorder, single episode, unspecified: Secondary | ICD-10-CM | POA: Diagnosis not present

## 2020-04-06 DIAGNOSIS — E872 Acidosis: Secondary | ICD-10-CM | POA: Diagnosis present

## 2020-04-06 DIAGNOSIS — D72819 Decreased white blood cell count, unspecified: Secondary | ICD-10-CM | POA: Diagnosis not present

## 2020-04-06 DIAGNOSIS — Z96653 Presence of artificial knee joint, bilateral: Secondary | ICD-10-CM | POA: Diagnosis present

## 2020-04-06 DIAGNOSIS — Z880 Allergy status to penicillin: Secondary | ICD-10-CM | POA: Diagnosis not present

## 2020-04-06 DIAGNOSIS — U071 COVID-19: Secondary | ICD-10-CM | POA: Diagnosis not present

## 2020-04-06 DIAGNOSIS — K3184 Gastroparesis: Secondary | ICD-10-CM | POA: Diagnosis present

## 2020-04-06 DIAGNOSIS — Z793 Long term (current) use of hormonal contraceptives: Secondary | ICD-10-CM | POA: Diagnosis not present

## 2020-04-06 DIAGNOSIS — R0602 Shortness of breath: Secondary | ICD-10-CM | POA: Diagnosis not present

## 2020-04-06 DIAGNOSIS — J9600 Acute respiratory failure, unspecified whether with hypoxia or hypercapnia: Secondary | ICD-10-CM | POA: Diagnosis present

## 2020-04-06 LAB — COMPREHENSIVE METABOLIC PANEL
ALT: 42 U/L (ref 0–44)
AST: 55 U/L — ABNORMAL HIGH (ref 15–41)
Albumin: 3.3 g/dL — ABNORMAL LOW (ref 3.5–5.0)
Alkaline Phosphatase: 62 U/L (ref 38–126)
Anion gap: 11 (ref 5–15)
BUN: 8 mg/dL (ref 8–23)
CO2: 24 mmol/L (ref 22–32)
Calcium: 8.3 mg/dL — ABNORMAL LOW (ref 8.9–10.3)
Chloride: 99 mmol/L (ref 98–111)
Creatinine, Ser: 0.71 mg/dL (ref 0.44–1.00)
GFR calc Af Amer: >60 mL/min (ref >60)
GFR calc non Af Amer: >60 mL/min (ref >60)
Glucose, Bld: 128 mg/dL — ABNORMAL HIGH (ref 70–99)
Potassium: 3 mmol/L — ABNORMAL LOW (ref 3.5–5.1)
Sodium: 134 mmol/L — ABNORMAL LOW (ref 135–145)
Total Bilirubin: 0.8 mg/dL (ref 0.3–1.2)
Total Protein: 6.5 g/dL (ref 6.5–8.1)

## 2020-04-06 LAB — SARS CORONAVIRUS 2 BY RT PCR (HOSPITAL ORDER, PERFORMED IN ~~LOC~~ HOSPITAL LAB): SARS Coronavirus 2: POSITIVE — AB

## 2020-04-06 LAB — CBC WITH DIFFERENTIAL/PLATELET
Abs Immature Granulocytes: 0.02 10*3/uL (ref 0.00–0.07)
Basophils Absolute: 0 10*3/uL (ref 0.0–0.1)
Basophils Relative: 0 %
Eosinophils Absolute: 0 10*3/uL (ref 0.0–0.5)
Eosinophils Relative: 0 %
HCT: 41.1 % (ref 36.0–46.0)
Hemoglobin: 13.8 g/dL (ref 12.0–15.0)
Immature Granulocytes: 1 %
Lymphocytes Relative: 20 %
Lymphs Abs: 0.8 10*3/uL (ref 0.7–4.0)
MCH: 30.2 pg (ref 26.0–34.0)
MCHC: 33.6 g/dL (ref 30.0–36.0)
MCV: 89.9 fL (ref 80.0–100.0)
Monocytes Absolute: 0.3 10*3/uL (ref 0.1–1.0)
Monocytes Relative: 8 %
Neutro Abs: 2.8 10*3/uL (ref 1.7–7.7)
Neutrophils Relative %: 71 %
Platelets: 239 10*3/uL (ref 150–400)
RBC: 4.57 MIL/uL (ref 3.87–5.11)
RDW: 13.2 % (ref 11.5–15.5)
WBC: 4 10*3/uL (ref 4.0–10.5)
nRBC: 0 % (ref 0.0–0.2)

## 2020-04-06 LAB — FERRITIN: Ferritin: 320 ng/mL — ABNORMAL HIGH (ref 11–307)

## 2020-04-06 LAB — LACTIC ACID, PLASMA
Lactic Acid, Venous: 3.2 mmol/L (ref 0.5–1.9)
Lactic Acid, Venous: 1.2 mmol/L (ref 0.5–1.9)

## 2020-04-06 LAB — FIBRINOGEN: Fibrinogen: 532 mg/dL — ABNORMAL HIGH (ref 210–475)

## 2020-04-06 LAB — LACTATE DEHYDROGENASE: LDH: 310 U/L — ABNORMAL HIGH (ref 98–192)

## 2020-04-06 LAB — C-REACTIVE PROTEIN: CRP: 3.6 mg/dL — ABNORMAL HIGH (ref <1.0)

## 2020-04-06 LAB — TRIGLYCERIDES: Triglycerides: 73 mg/dL (ref <150)

## 2020-04-06 LAB — PROCALCITONIN: Procalcitonin: <0.1 ng/mL

## 2020-04-06 LAB — D-DIMER, QUANTITATIVE: D-Dimer, Quant: 1.56 ug/mL-FEU — ABNORMAL HIGH (ref 0.00–0.50)

## 2020-04-06 MED ORDER — SODIUM CHLORIDE 0.9% FLUSH: 3.0000 mL | Freq: Two times a day (BID) | INTRAVENOUS | Status: DC

## 2020-04-06 MED ORDER — GUAIFENESIN-DM 100-10 MG/5ML PO SYRP: 10.0000 mL | ORAL_SOLUTION | ORAL | Status: DC | PRN

## 2020-04-06 MED ORDER — FAMOTIDINE 20 MG PO TABS
20.0000 mg | ORAL_TABLET | Freq: Every day | ORAL | Status: DC
  Administered 2020-04-06: 20 mg via ORAL
  Filled 2020-04-06: qty 1

## 2020-04-06 MED ORDER — SODIUM CHLORIDE 0.9% FLUSH: 3.0000 mL | INTRAVENOUS | Status: DC | PRN

## 2020-04-06 MED ORDER — ZINC SULFATE 220 (50 ZN) MG PO CAPS
220.0000 mg | ORAL_CAPSULE | Freq: Every day | ORAL | Status: DC
  Administered 2020-04-06: 220 mg via ORAL
  Filled 2020-04-06: qty 1

## 2020-04-06 MED ORDER — ACETAMINOPHEN 325 MG PO TABS: 650.0000 mg | ORAL_TABLET | Freq: Four times a day (QID) | ORAL | Status: DC | PRN

## 2020-04-06 MED ORDER — SODIUM CHLORIDE 0.9 % IV SOLN
100.0000 mg | INTRAVENOUS | Status: AC
  Administered 2020-04-06 (×2): 100 mg via INTRAVENOUS
  Filled 2020-04-06 (×2): qty 20

## 2020-04-06 MED ORDER — ADULT MULTIVITAMIN W/MINERALS CH: 1.0000 | ORAL_TABLET | Freq: Every day | ORAL | Status: DC

## 2020-04-06 MED ORDER — ENOXAPARIN SODIUM 40 MG/0.4ML ~~LOC~~ SOLN
40.0000 mg | SUBCUTANEOUS | Status: DC
  Filled 2020-04-06: qty 0.4

## 2020-04-06 MED ORDER — DEXAMETHASONE SODIUM PHOSPHATE 10 MG/ML IJ SOLN
6.0000 mg | Freq: Once | INTRAMUSCULAR | Status: AC
  Administered 2020-04-06: 6 mg via INTRAVENOUS
  Filled 2020-04-06: qty 1

## 2020-04-06 MED ORDER — ONDANSETRON HCL 4 MG PO TABS: 4.0000 mg | ORAL_TABLET | Freq: Four times a day (QID) | ORAL | Status: DC | PRN

## 2020-04-06 MED ORDER — ONDANSETRON HCL 4 MG/2ML IJ SOLN: 4.0000 mg | Freq: Four times a day (QID) | INTRAMUSCULAR | Status: DC | PRN

## 2020-04-06 MED ORDER — POTASSIUM CHLORIDE CRYS ER 20 MEQ PO TBCR
40.0000 meq | EXTENDED_RELEASE_TABLET | Freq: Once | ORAL | Status: AC
  Administered 2020-04-06: 40 meq via ORAL
  Filled 2020-04-06: qty 2

## 2020-04-06 MED ORDER — LEVOFLOXACIN IN D5W 500 MG/100ML IV SOLN
500.0000 mg | Freq: Once | INTRAVENOUS | Status: DC
  Filled 2020-04-06: qty 100

## 2020-04-06 MED ORDER — ASCORBIC ACID 500 MG PO TABS
500.0000 mg | ORAL_TABLET | Freq: Every day | ORAL | Status: DC
  Administered 2020-04-06: 500 mg via ORAL
  Filled 2020-04-06 (×2): qty 1

## 2020-04-06 MED ORDER — SODIUM CHLORIDE 0.9 % IV SOLN: 250.0000 mL | INTRAVENOUS | Status: DC | PRN

## 2020-04-06 MED ORDER — HYDROCOD POLST-CPM POLST ER 10-8 MG/5ML PO SUER: 5.0000 mL | Freq: Two times a day (BID) | ORAL | Status: DC | PRN

## 2020-04-06 MED ORDER — METHYLPREDNISOLONE SODIUM SUCC 125 MG IJ SOLR: 60.0000 mg | Freq: Two times a day (BID) | INTRAMUSCULAR | Status: DC

## 2020-04-06 MED ORDER — LEVOTHYROXINE SODIUM 50 MCG PO TABS: 75.0000 ug | ORAL_TABLET | Freq: Every day | ORAL | Status: DC

## 2020-04-06 MED ORDER — SODIUM CHLORIDE 0.9 % IV SOLN: 100.0000 mg | Freq: Every day | INTRAVENOUS | Status: DC

## 2020-04-06 MED ORDER — SODIUM CHLORIDE 0.9% FLUSH
3.0000 mL | INTRAVENOUS | Status: DC | PRN
  Administered 2020-04-06: 3 mL via INTRAVENOUS

## 2020-04-06 NOTE — ED Notes (Signed)
Patient provided with dinner tray. Patient comfortable and alert at time of rounding.

## 2020-04-06 NOTE — ED Notes (Signed)
Patient has left with her daughter AMA, paperwork has been signed by the daughter.

## 2020-04-06 NOTE — ED Notes (Signed)
Assumed care of patient at this time, nad noted, sr up x2, bed locked and low, call bell w/I reach.  Will continue to monitor.  Bedside commode has been provided.  

## 2020-04-06 NOTE — ED Triage Notes (Addendum)
Patient c/o SOB and cough x 2 weeks and worsening.

## 2020-04-06 NOTE — ED Provider Notes (Signed)
[facility]-EMERGENCY DEPT Provider Note   CSN: [id number] Arrival date & time: 04/06/20  1125     History Chief Complaint  Patient presents with  . Shortness of Breath  . Cough    Kristen MooresBarbara E Leonard is a 79 y.o. female.  HPI   Pt states she started having a cough a couple of weeks ago.  She denies any fevers.  She is not eating well.  She does not have an appetite.  She feels like her breathing is getting worse.  No CP or leg swelling.  No history of lung disease or breathing problems.   No covid vaccination.  Note: Initially pt indicated that she did not want any evaluation for covid.  She did not believe in it.  After a discussion about the treatment and options and how it would differ depending on results, pt agreed to further evaluation including covid testing. Past Medical History:  Diagnosis Date  . Acid reflux disease   . Arthritis   . Atypical chest pain   . Back pain   . Depression   . Gastroparesis   . H/O: hysterectomy   . Hiatal hernia   . Hypothyroidism   . 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 Neck pain   . Self inflicted injury   . Sleep apnea     Patient Active Problem List   Diagnosis Date Noted  . Shortness of breath 04/11/2018  . History of left knee replacement 07/18/2017  . S/P TKR (total knee replacement) using cement, right 02/28/2017  . Unilateral primary osteoarthritis, left knee 01/05/2017  . Unilateral primary osteoarthritis, right knee 01/05/2017  . Asthma 01/15/2014  . 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 Atypical chest pain 05/22/2012  . Hypothyroidism 08/12/2008  . Gastro-esophageal reflux disease with esophagitis 06/12/2007    Past Surgical History:  Procedure Laterality Date  . ABDOMINAL HYSTERECTOMY    . COLONOSCOPY W/ BIOPSIES AND POLYPECTOMY    . NECK SURGERY     Metal plate in neck for degenerative disc disease  . TOTAL KNEE ARTHROPLASTY Right 02/28/2017   Procedure: RIGHT TOTAL KNEE ARTHROPLASTY;  Surgeon:  Valeria BatmanWhitfield, Peter W, MD;  [facility];  Service: Orthopedics;  Laterality: Right;  . TOTAL KNEE ARTHROPLASTY Left 07/18/2017   Procedure: LEFT TOTAL KNEE ARTHROPLASTY;  Surgeon: Valeria BatmanWhitfield, Peter W, MD;  Location: [facility] OR;  Service: Orthopedics;  Laterality: Left;     OB History   No obstetric history on file.     Family History  Problem Relation Age of Onset  . Alzheimer's disease Mother   . Renal Disease Father     Social History   Tobacco Use  . Smoking status: Never Smoker  . Smokeless tobacco: Never Used  Vaping Use  . Vaping Use: Never used  Substance Use Topics  . Alcohol use: No  . Drug use: No    Home Medications Prior to Admission medications   Medication Sig Start Date End Date Taking? Authorizing Provider  albuterol (PROVENTIL HFA;VENTOLIN HFA) 108 (90 Base) MCG/ACT inhaler Inhale 2 puffs every 6 (six) hours as needed into the lungs for wheezing or shortness of breath.  01/15/14   [provider]  co-enzyme Q-10 30 MG capsule Take 30 mg by mouth daily.    [provider]  doxycycline (MONODOX) 100 MG capsule Take 100 mg by mouth 2 (two) times daily. 7 day supply 03/30/20   [provider]  fluticasone (FLONASE) 50 MCG/ACT nasal  spray Place 1 spray into both nostrils 2 (two) times daily. 03/30/20   [provider]  ibuprofen (ADVIL,MOTRIN) 200 MG tablet Take 200 mg by mouth every 6 (six) hours as needed.    [provider]  levothyroxine (SYNTHROID, LEVOTHROID) 75 MCG tablet Take 75 mcg by mouth daily before breakfast.  05/12/12   [provider]  Multiple Vitamins-Minerals (MULTIVITAMIN WITH MINERALS) tablet Take 1 tablet by mouth daily.    [provider]    Allergies    Penicillins  Review of Systems   Review of Systems  All other systems reviewed and are negative.   Physical Exam Updated Vital Signs BP (!) 154/91   Pulse 95   Temp 98.5 F (36.9 C) (Oral)   Resp 18   Ht 1.651 m (5\' 5" )   Wt  86.2 kg   SpO2 90% Comment: Patient placed on O2 2l/min via [employer] Sats increased to 93%.  BMI 31.62 kg/m   Physical Exam Vitals and nursing note reviewed.  Constitutional:      Appearance: She is well-developed. She is not diaphoretic.  HENT:     Head: Normocephalic and atraumatic.     Right Ear: External ear normal.     Left Ear: External ear normal.  Eyes:     General: No scleral icterus.       Right eye: No discharge.        Left eye: No discharge.     Conjunctiva/sclera: Conjunctivae normal.  Neck:     Trachea: No tracheal deviation.  Cardiovascular:     Rate and Rhythm: Normal rate and regular rhythm.  Pulmonary:     Effort: Pulmonary effort is normal. No respiratory distress.     Breath sounds: Normal breath sounds. No stridor. No wheezing or rales.  Abdominal:     General: Bowel sounds are normal. There is no distension.     Palpations: Abdomen is soft.     Tenderness: There is no abdominal tenderness. There is no guarding or rebound.  Musculoskeletal:        General: No tenderness.     Cervical back: Neck supple.  Skin:    General: Skin is warm and dry.     Findings: No rash.  Neurological:     Mental Status: She is alert.     Cranial Nerves: No cranial nerve deficit (no facial droop, extraocular movements intact, no slurred speech).     Sensory: No sensory deficit.     Motor: No abnormal muscle tone or seizure activity.     Coordination: Coordination normal.     ED Results / Procedures / Treatments   Labs (all labs ordered are listed, but only abnormal results are displayed) Labs Reviewed  SARS CORONAVIRUS 2 BY RT PCR (HOSPITAL ORDER, PERFORMED IN [facility]) - Abnormal; Notable for the following components:      Result Value   SARS Coronavirus 2 POSITIVE (*)    All other components within normal limits  LACTIC ACID, PLASMA - Abnormal; Notable for the following components:   Lactic Acid, Venous 3.2 (*)    All other components within normal  limits  COMPREHENSIVE METABOLIC PANEL - Abnormal; Notable for the following components:   Sodium 134 (*)    Potassium 3.0 (*)    Glucose, Bld 128 (*)    Calcium 8.3 (*)    Albumin 3.3 (*)    AST 55 (*)    All other components within normal limits  D-DIMER, QUANTITATIVE (  NOT AT [facility]) - Abnormal; Notable for the following components:   D-Dimer, Quant 1.56 (*)    All other components within normal limits  LACTATE DEHYDROGENASE - Abnormal; Notable for the following components:   LDH 310 (*)    All other components within normal limits  FERRITIN - Abnormal; Notable for the following components:   Ferritin 320 (*)    All other components within normal limits  FIBRINOGEN - Abnormal; Notable for the following components:   Fibrinogen 532 (*)    All other components within normal limits  C-REACTIVE PROTEIN - Abnormal; Notable for the following components:   CRP 3.6 (*)    All other components within normal limits  CULTURE, BLOOD (ROUTINE X 2)  CULTURE, BLOOD (ROUTINE X 2)  CBC WITH DIFFERENTIAL/PLATELET  PROCALCITONIN  TRIGLYCERIDES  LACTIC ACID, PLASMA    EKG EKG Interpretation  Date/Time:  Monday April 06 2020 11:38:00 EDT Ventricular Rate:  91 PR Interval:    QRS Duration: 94 QT Interval:  362 QTC Calculation: 446 R Axis:   20 Text Interpretation: Sinus rhythm Consider anterior infarct Minimal ST depression, diffuse leads 12 Lead; Mason-L[place]ar No significant change since last tracing Confirmed by Linwood Dibbles [phone]) on 04/06/2020 1:00:10 PM   Radiology DG Chest 2 View  Result Date: 04/06/2020 CLINICAL DATA:  Shortness of breath for 2 weeks.  Worsening. EXAM: CHEST - 2 VIEW COMPARISON:  03/12/2018 FINDINGS: There is mild bilateral interstitial thickening with patchy alveolar airspace opacities at the lung bases. There is no pleural effusion or pneumothorax. The heart and mediastinal contours are unremarkable. There is no acute osseous abnormality. Prior lower anterior cervical  fusion. IMPRESSION: Mild bilateral interstitial thickening with patchy alveolar airspace opacities at the lung bases concerning for multilobar pneumonia including atypical infection. Electronically Signed   By: Elige Ko   On: 04/06/2020 11:57    Procedures .Critical Care Performed by: Linwood Dibbles, MD Authorized by: Linwood Dibbles, MD   Critical care provider statement:    Critical care time (minutes):  30   Critical care was time spent personally by me on the following activities:  Discussions with consultants, evaluation of patient's response to treatment, examination of patient, ordering and performing treatments and interventions, ordering and review of laboratory studies, ordering and review of radiographic studies, pulse oximetry, re-evaluation of patient's condition, obtaining history from patient or surrogate and review of old charts   (including critical care time)  Medications Ordered in ED Medications  sodium chloride flush (NS) 0.9 % injection 3 mL (has no administration in time range)  sodium chloride flush (NS) 0.9 % injection 3 mL (has no administration in time range)  0.9 %  sodium chloride infusion (has no administration in time range)  dexamethasone (DECADRON) injection 6 mg (has no administration in time range)    ED Course  I have reviewed the triage vital signs and the nursing notes.  Pertinent labs & imaging results that were available during my care of the patient were reviewed by me and considered in my medical decision making (see chart for details).  Clinical Course as of Apr 07 1531  Mon Apr 06, 2020  1428 Notify the patient's lactic acid level is elevated.  Patient also has an elevated D-dimer and fibrinogen.  Suspect this is related to a Covid infection of the is currently pending.  I will order empiric antibiotics.  With her being normotensive and not tachycardic and a suspicion of Covid I will hold off on a fluid bolus.   [  JK]  1512 Covid test is positive    [JK]  1531 With the patient's positive Covid virus infection I will cancel the Levaquin in order remdesivir and Decadron   [JK]  1532 Findings discussed with the patient.  Explained to her that she does have Covid virus.  Patient agrees to treatment for this and agrees to hospitalization   [JK]    Clinical Course User Index [JK] Linwood Dibbles, MD   MDM Rules/Calculators/A&P                          Patient presented to the ED for evaluation of cough and shortness of breath.  Patient symptoms were concerning for Covid.  Laboratory test showed elevated inflammatory markers consistent with Covid virus.  Chest x-ray does show multifocal pneumonia.  Patient's Covid virus is positive.  Her oxygenation is stable on 2 L of nasal cannula.  Patient does have an elevated lactic acid level but she is not hypotensive and is not tachycardic.  I suspect a lactic acidosis is associated with her Covid virus infection and not sepsis.  Avoid aggressive IV hydration for this reason.  Plan on admission for further treatment. Final Clinical Impression(s) / ED Diagnoses Final diagnoses:  Pneumonia due to COVID-19 virus      Linwood Dibbles, MD 04/06/20 1535

## 2020-04-06 NOTE — H&P (Signed)
[facility] History and Physical  ALYSIANA Leonard OVZ:[id number] DOB: 1941/03/10 DOA: 04/06/2020   PCP: Estanislado Pandy, MD  Specialists: None  Chief Complaint: Shortness of breath  HPI: [name] is a 79 y.o. female with a past medical history of hypothyroidism who developed a cough about a couple weeks ago.  No fever.  Her symptoms started getting worse.  She also developed shortness of breath which also worsened.  So she decided to come to the hospital for further evaluation.  According to the emergency room provider patient did not initially want evaluation for Covid as apparently she did not believe in it.  After much convincing she agreed.  She indeed tested positive for COVID-19.  Patient denies any chest pain.  No nausea vomiting.  Denies any loose stools.  Has had a dry cough.  She does not know how she may have contracted the virus.  She has not been vaccinated against COVID-19.  In the emergency department she was found to be mildly hypoxic saturating in the mid 80s.  She has been placed on oxygen.  X-ray showed pneumonia.  She will be hospitalized for further management.  Home Medications: Prior to Admission medications   Medication Sig Start Date End Date Taking? Authorizing Provider  doxycycline (MONODOX) 100 MG capsule Take 100 mg by mouth 2 (two) times daily. 7 day supply 03/30/20  Yes [provider]  levothyroxine (SYNTHROID, LEVOTHROID) 75 MCG tablet Take 75 mcg by mouth daily before breakfast.  05/12/12  Yes [provider]  Multiple Vitamins-Minerals (MULTIVITAMIN WITH MINERALS) tablet Take 1 tablet by mouth daily.   Yes [provider]    Allergies:  Allergies  Allergen Reactions  . Penicillins Hives    Has patient had a PCN reaction causing immediate rash, facial/tongue/throat swelling, SOB or lightheadedness with hypotension: Unknown Has patient had a PCN reaction causing severe rash involving mucus membranes or skin necrosis:  Unknown Has patient had a PCN reaction that required hospitalization: No Has patient had a PCN reaction occurring within the last 10 years: No If all of the above answers are "NO", then may proceed with Cephalosporin use.    Past Medical History: Past Medical History:  Diagnosis Date  . Acid reflux disease   . Arthritis   . Atypical chest pain   . Back pain   . Depression   . Gastroparesis   . H/O: hysterectomy   . Hiatal hernia   . Hypothyroidism   . 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 Neck pain   . Self inflicted injury   . Sleep apnea     Past Surgical History:  Procedure Laterality Date  . ABDOMINAL HYSTERECTOMY    . COLONOSCOPY W/ BIOPSIES AND POLYPECTOMY    . NECK SURGERY     Metal plate in neck for degenerative disc disease  . TOTAL KNEE ARTHROPLASTY Right 02/28/2017   Procedure: RIGHT TOTAL KNEE ARTHROPLASTY;  Surgeon: [name], MD;  Location: [facility] OR;  Service: Orthopedics;  Laterality: Right;  . TOTAL KNEE ARTHROPLASTY Left 07/18/2017   Procedure: LEFT TOTAL KNEE ARTHROPLASTY;  Surgeon: [name], MD;  Location: [facility] OR;  Service: Orthopedics;  Laterality: Left;    Social History:  No history of smoking alcohol use or illicit drug use.  Independent with daily activities    Family History:  Family History  Problem Relation Age of Onset  . Alzheimer's disease Mother   . Renal Disease Father  Review of Systems - History obtained from the patient General ROS: positive for  - fatigue Psychological ROS: negative Ophthalmic ROS: negative ENT ROS: negative Allergy and Immunology ROS: negative Hematological and Lymphatic ROS: negative Endocrine ROS: negative Respiratory ROS: As in HPI Cardiovascular ROS: no chest pain or dyspnea on exertion Gastrointestinal ROS: no abdominal pain, change in bowel habits, or black or bloody stools Genito-Urinary ROS: no dysuria, trouble voiding, or hematuria Musculoskeletal ROS: negative Neurological ROS:  no TIA or stroke symptoms Dermatological ROS: negative  Physical Examination  Vitals:   04/06/20 1133 04/06/20 1138 04/06/20 1700  BP:  (!) 154/91 132/82  Pulse:  95 87  Resp:  18 (!) 22  Temp:  98.5 F (36.9 C)   TempSrc:  Oral   SpO2:  90% 93%  Weight: 86.2 kg    Height: 5\' 5"  (1.651 m)      BP 132/82   Pulse 87   Temp 98.5 F (36.9 C) (Oral)   Resp (!) 22   Ht 5\' 5"  (1.651 m)   Wt 86.2 kg   SpO2 93%   BMI 31.62 kg/m   General appearance: alert, cooperative, appears stated age and no distress Head: Normocephalic, without obvious abnormality, atraumatic Eyes: conjunctivae/corneas clear. PERRL, EOM's intact. F Throat: lips, mucosa, and tongue normal; teeth and gums normal Neck: no adenopathy, no carotid bruit, no JVD, supple, symmetrical, trachea midline and thyroid not enlarged, symmetric, no tenderness/mass/nodules Resp: Mildly tachypneic at rest.  Coarse breath sounds bilaterally with crackles at the bases.  No wheezing or rhonchi. Cardio: regular rate and rhythm, S1, S2 normal, no murmur, click, rub or gallop GI: soft, non-tender; bowel sounds normal; no masses,  no organomegaly Extremities: extremities normal, atraumatic, no cyanosis or edema Pulses: 2+ and symmetric Skin: Skin color, texture, turgor normal. No rashes or lesions Lymph nodes: Cervical, supraclavicular, and axillary nodes normal. Neurologic: Alert and oriented x3.  No focal neurological deficits.   Labs on Admission: I have personally reviewed following labs and imaging studies  CBC: Recent Labs  Lab 04/06/20 1336  WBC 4.0  NEUTROABS 2.8  HGB 13.8  HCT 41.1  MCV 89.9  PLT 239   Basic Metabolic Panel: Recent Labs  Lab 04/06/20 1336  NA 134*  K 3.0*  CL 99  CO2 24  GLUCOSE 128*  BUN 8  CREATININE 0.71  CALCIUM 8.3*   GFR: Estimated Creatinine Clearance: 61.8 mL/min (by C-G formula based on SCr of 0.71 mg/dL). Liver Function Tests: Recent Labs  Lab 04/06/20 1336  AST 55*   ALT 42  ALKPHOS 62  BILITOT 0.8  PROT 6.5  ALBUMIN 3.3*   Lipid Profile: Recent Labs    04/06/20 1336  TRIG 73   Anemia Panel: Recent Labs    04/06/20 1336  FERRITIN 320*     Radiological Exams on Admission: DG Chest 2 View  Result Date: 04/06/2020 CLINICAL DATA:  Shortness of breath for 2 weeks.  Worsening. EXAM: CHEST - 2 VIEW COMPARISON:  03/12/2018 FINDINGS: There is mild bilateral interstitial thickening with patchy alveolar airspace opacities at the lung bases. There is no pleural effusion or pneumothorax. The heart and mediastinal contours are unremarkable. There is no acute osseous abnormality. Prior lower anterior cervical fusion. IMPRESSION: Mild bilateral interstitial thickening with patchy alveolar airspace opacities at the lung bases concerning for multilobar pneumonia including atypical infection. Electronically Signed   By: 06/06/2020   On: 04/06/2020 11:57    My interpretation of Electrocardiogram: Sinus rhythm in  the 90s.  Normal axis.  Intervals are normal.  No concerning ST or T wave changes.   Problem List  Principal Problem:   Pneumonia due to COVID-19 virus Active Problems:   Hypothyroidism   Gastro-esophageal reflux disease with esophagitis   Acute respiratory failure (HCC)   Assessment: This is a 79 year old Caucasian female with past medical history of hypothyroidism who comes in with few day history of cough and worsening shortness of breath and is found to have pneumonia due to COVID-19.  She has acute respiratory failure with hypoxia.    Plan:  1. Acute respiratory failure with hypoxia/pneumonia due to COVID-19: Patient will need hospitalization.  She is requiring about 3 L of oxygen.  Inflammatory markers are only minimally elevated.  D-dimer 1.56.  Patient will be given remdesivir and steroids.  Actemra was discussed.  She denies any history of tuberculosis, liver disease or bowel perforation or other bowel issues.  If her oxygenation  worsens or her inflammatory markers worsen she is agreeable to Actemra if needed.  Incentive spirometry, mobilization, flutter valve.  Trend inflammatory markers.  DVT prophylaxis.  2. Hypokalemia: This will be repleted.  3.  Lactic acidosis: Possibly due to hypovolemia.  Has improved from 3.2-1.2.  4. Hypothyroidism: Continue with her home medications.   DVT Prophylaxis: Lovenox Code Status: Full code Family Communication: Discussed with the patient Disposition: Hopefully return home in improved Consults called: None Admission Status: Status is: Inpatient  Remains inpatient appropriate because:IV treatments appropriate due to intensity of illness or inability to take PO and Inpatient level of care appropriate due to severity of illness   Dispo: The patient is from: Home              Anticipated d/c is to: Home              Anticipated d/c date is: 3 days              Patient currently is not medically stable to d/c.  Severity of Illness: The appropriate patient status for this patient is INPATIENT. Inpatient status is judged to be reasonable and necessary in order to provide the required intensity of service to ensure the patient's safety. The patient's presenting symptoms, physical exam findings, and initial radiographic and laboratory data in the context of their chronic comorbidities is felt to place them at high risk for further clinical deterioration. Furthermore, it is not anticipated that the patient will be medically stable for discharge from the hospital within 2 midnights of admission. The following factors support the patient status of inpatient.   " The patient's presenting symptoms include shortness of breath. " The worrisome physical exam findings include crackles in the lungs. " The initial radiographic and laboratory data are worrisome because of pneumonia. " The chronic co-morbidities include hypothyroidism.   * I certify that at the point of admission it is my  clinical judgment that the patient will require inpatient hospital care spanning beyond 2 midnights from the point of admission due to high intensity of service, high risk for further deterioration and high frequency of surveillance required.*  Further management decisions will depend on results of further testing and patient's response to treatment.   Shadoe Bethel [facility]  [facility] [employer] on [email or web address]  04/06/2020, 6:26 PM

## 2020-04-06 NOTE — ED Notes (Signed)
RN attempted to call both numbers listed for patient's daughter. No answer at this time, will attempt to call and update patient's daughter again later

## 2020-04-06 NOTE — ED Notes (Signed)
Patient states that she wants to go home and that she is not staying.  Patient has been encouraged by this RN to stay due her need for additional oxygen at this time.  Admitting provider has been paged and notified at this time.

## 2020-04-06 NOTE — ED Notes (Signed)
Patient's daughter updated on plan of care. Patient's daughter understands the tests that are being preformed. Patient's daughter reports that her mother's Ephriam Knuckles beliefs do not allow her to receive any care related to the COVID-19 Virus. Patient reportedly does not believe in the virus and her daughter is requesting updates regarding patient treatment plans

## 2020-04-06 NOTE — ED Notes (Signed)
Patient has spoken with the hospitalist and is still requesting discharge.  This RN has spoken with the patient's daughter Selena Batten) who states that she will be taking her mother to another hospital.

## 2020-04-07 DIAGNOSIS — D72819 Decreased white blood cell count, unspecified: Secondary | ICD-10-CM | POA: Diagnosis not present

## 2020-04-07 DIAGNOSIS — U071 COVID-19: Secondary | ICD-10-CM | POA: Diagnosis not present

## 2020-04-07 DIAGNOSIS — J189 Pneumonia, unspecified organism: Secondary | ICD-10-CM | POA: Diagnosis not present

## 2020-04-07 NOTE — Discharge Summary (Signed)
[facility]  Physician Discharge Summary   Patient ID: [name] MRN: [id number] DOB/AGE: 31-Oct-1940 79 y.o.  Admit date: 04/06/2020 Discharge date: 04/07/2020  PCP: Estanislado Pandy, MD   PATIENT LEFT AGAINST MEDICAL ADVICE   INITIAL HISTORY: [name] is a 79 y.o. female with a past medical history of hypothyroidism who developed a cough about a couple weeks ago.  No fever.  Her symptoms started getting worse.  She also developed shortness of breath which also worsened.  So she decided to come to the hospital for further evaluation.  According to the emergency room provider patient did not initially want evaluation for Covid as apparently she did not believe in it.  After much convincing she agreed.  She indeed tested positive for COVID-19.  Patient denies any chest pain.  No nausea vomiting.  Denies any loose stools.  Has had a dry cough.  She does not know how she may have contracted the virus.  She has not been vaccinated against COVID-19.  In the emergency department she was found to be mildly hypoxic saturating in the mid 80s.  She has been placed on oxygen.  X-ray showed pneumonia.  She will be hospitalized for further management.   HOSPITAL COURSE:   Patient was hospitalized for acute respiratory failure with hypoxia secondary to pneumonia from COVID-19.  She was placed on remdesivir and steroids.  She was requiring 3 L of oxygen.  Inflammatory markers were minimally elevated.  She was also noted to have lactic acidosis which corrected with fluids.  However after a few hours of stay in the emergency department she wanted to leave.  Discussions were held with her regarding her oxygen requirements.  She was encouraged to stay in the hospital.  Subsequently it looks like the daughter discussed with the night [facility] coverage.    Despite this it appears that the patient wanted to leave.  So she left AGAINST MEDICAL ADVICE.     PERTINENT LABS:  The results of  significant diagnostics from this hospitalization (including imaging, microbiology, ancillary and laboratory) are listed below for reference.    Microbiology: Recent Results (from the past 240 hour(s))  SARS Coronavirus 2 by RT PCR (hospital order, performed in [facility] hospital lab) Nasopharyngeal Nasopharyngeal Swab     Status: Abnormal   Collection Time: 04/06/20  1:36 PM   Specimen: Nasopharyngeal Swab  Result Value Ref Range Status   SARS Coronavirus 2 POSITIVE (A) NEGATIVE Final    Comment: RESULT CALLED TO, READ BACK BY AND VERIFIED WITH: ZULETA,K. RN @1500  ON 08.09.2021 BY COHEN,K (NOTE) SARS-CoV-2 target nucleic acids are DETECTED  SARS-CoV-2 RNA is generally detectable in upper respiratory specimens  during the acute phase of infection.  Positive results are indicative  of the presence of the identified virus, but do not rule out bacterial infection or co-infection with other pathogens not detected by the test.  Clinical correlation with patient history and  other diagnostic information is necessary to determine patient infection status.  The expected result is negative.  Fact Sheet for Patients:   10.09.2021   Fact Sheet for Healthcare Providers:   BoilerBrush.com.cy    This test is not yet approved or cleared by the https://pope.com/ FDA and  has been authorized for detection and/or diagnosis of SARS-CoV-2 by FDA under an Emergency Use Authorization (EUA).  This EUA will remain in effect (meanin g this test can be used) for the duration of  the COVID-19 declaration under Section 564(b)(1)  of the Act, 21 U.S.C. section 360-bbb-3(b)(1), unless the authorization is terminated or revoked sooner.  Performed at [facility], [address] [place], [place] [address]      Labs:  COVID-19 Labs  Recent Labs    04/06/20 1336  DDIMER 1.56*  FERRITIN 320*  LDH 310*  CRP 3.6*    Lab Results   Component Value Date   SARSCOV2NAA POSITIVE (A) 04/06/2020      Basic Metabolic Panel: Recent Labs  Lab 04/06/20 1336  NA 134*  K 3.0*  CL 99  CO2 24  GLUCOSE 128*  BUN 8  CREATININE 0.71  CALCIUM 8.3*   Liver Function Tests: Recent Labs  Lab 04/06/20 1336  AST 55*  ALT 42  ALKPHOS 62  BILITOT 0.8  PROT 6.5  ALBUMIN 3.3*   CBC: Recent Labs  Lab 04/06/20 1336  WBC 4.0  NEUTROABS 2.8  HGB 13.8  HCT 41.1  MCV 89.9  PLT 239     IMAGING STUDIES DG Chest 2 View  Result Date: 04/06/2020 CLINICAL DATA:  Shortness of breath for 2 weeks.  Worsening. EXAM: CHEST - 2 VIEW COMPARISON:  03/12/2018 FINDINGS: There is mild bilateral interstitial thickening with patchy alveolar airspace opacities at the lung bases. There is no pleural effusion or pneumothorax. The heart and mediastinal contours are unremarkable. There is no acute osseous abnormality. Prior lower anterior cervical fusion. IMPRESSION: Mild bilateral interstitial thickening with patchy alveolar airspace opacities at the lung bases concerning for multilobar pneumonia including atypical infection. Electronically Signed   By: Elige Ko   On: 04/06/2020 11:57     Osvaldo Shipper  [facility] Pager on [email or web address]  04/07/2020, 7:30 AM

## 2020-04-08 ENCOUNTER — Telehealth: Payer: Self-pay

## 2020-04-08 NOTE — Telephone Encounter (Signed)
Phone call placed to PCP office to provide update Dr. Manus Gunning regarding recent ED visits and requesting hospice/palliative, requesting oxygen. PCP was not aware of details. Will fax over discharge summary for PCP.

## 2020-04-08 NOTE — Telephone Encounter (Signed)
Received message to call patient regarding questions about Palliative care. Spoke with patient who shared that she has pneumonia and did not want to go back to hospital. Patient sounds short of breath over the phone and was requesting to go to hospice or palliative care and requesting that someone bring in oxygen in. Directed patient to go back to hospital but patient refused to go back to the hospital. Will update PCP of situation.

## 2020-04-09 DIAGNOSIS — J189 Pneumonia, unspecified organism: Secondary | ICD-10-CM | POA: Diagnosis not present

## 2020-04-10 DIAGNOSIS — R0602 Shortness of breath: Secondary | ICD-10-CM | POA: Diagnosis not present

## 2020-04-10 DIAGNOSIS — U071 COVID-19: Secondary | ICD-10-CM | POA: Diagnosis not present

## 2020-04-10 DIAGNOSIS — Z8616 Personal history of COVID-19: Secondary | ICD-10-CM | POA: Diagnosis not present

## 2020-04-11 LAB — CULTURE, BLOOD (ROUTINE X 2)
Culture: NO GROWTH
Culture: NO GROWTH
Special Requests: ADEQUATE

## 2020-04-14 DIAGNOSIS — M9904 Segmental and somatic dysfunction of sacral region: Secondary | ICD-10-CM | POA: Diagnosis not present

## 2020-04-14 DIAGNOSIS — M5136 Other intervertebral disc degeneration, lumbar region: Secondary | ICD-10-CM | POA: Diagnosis not present

## 2020-04-14 DIAGNOSIS — M9905 Segmental and somatic dysfunction of pelvic region: Secondary | ICD-10-CM | POA: Diagnosis not present

## 2020-04-14 DIAGNOSIS — M9903 Segmental and somatic dysfunction of lumbar region: Secondary | ICD-10-CM | POA: Diagnosis not present

## 2020-04-21 DIAGNOSIS — I10 Essential (primary) hypertension: Secondary | ICD-10-CM | POA: Diagnosis not present

## 2020-04-21 DIAGNOSIS — Z7189 Other specified counseling: Secondary | ICD-10-CM | POA: Diagnosis not present

## 2020-04-21 DIAGNOSIS — E039 Hypothyroidism, unspecified: Secondary | ICD-10-CM | POA: Diagnosis not present

## 2020-04-21 DIAGNOSIS — R7309 Other abnormal glucose: Secondary | ICD-10-CM | POA: Diagnosis not present

## 2020-04-21 DIAGNOSIS — Z0000 Encounter for general adult medical examination without abnormal findings: Secondary | ICD-10-CM | POA: Diagnosis not present

## 2020-04-29 ENCOUNTER — Emergency Department (HOSPITAL_COMMUNITY)
Admission: EM | Admit: 2020-04-29 | Discharge: 2020-04-29 | Disposition: A | Discharge status: Home / Self Care | Payer: Medicare PPO | Attending: Emergency Medicine | Admitting: Emergency Medicine

## 2020-04-29 ENCOUNTER — Encounter (HOSPITAL_COMMUNITY): Payer: Self-pay | Admitting: Emergency Medicine

## 2020-04-29 ENCOUNTER — Other Ambulatory Visit: Payer: Self-pay

## 2020-04-29 ENCOUNTER — Emergency Department (HOSPITAL_COMMUNITY): Payer: Medicare PPO

## 2020-04-29 DIAGNOSIS — R079 Chest pain, unspecified: Secondary | ICD-10-CM | POA: Insufficient documentation

## 2020-04-29 DIAGNOSIS — Z5321 Procedure and treatment not carried out due to patient leaving prior to being seen by health care provider: Secondary | ICD-10-CM | POA: Diagnosis not present

## 2020-04-29 DIAGNOSIS — J90 Pleural effusion, not elsewhere classified: Secondary | ICD-10-CM | POA: Diagnosis not present

## 2020-04-29 NOTE — ED Triage Notes (Signed)
Pt c/o chest pain that started about 2 hours ago. Pain is in center of her chest with no radiation.

## 2020-05-13 DIAGNOSIS — M9903 Segmental and somatic dysfunction of lumbar region: Secondary | ICD-10-CM | POA: Diagnosis not present

## 2020-05-13 DIAGNOSIS — M9905 Segmental and somatic dysfunction of pelvic region: Secondary | ICD-10-CM | POA: Diagnosis not present

## 2020-05-13 DIAGNOSIS — M9904 Segmental and somatic dysfunction of sacral region: Secondary | ICD-10-CM | POA: Diagnosis not present

## 2020-05-13 DIAGNOSIS — M5136 Other intervertebral disc degeneration, lumbar region: Secondary | ICD-10-CM | POA: Diagnosis not present

## 2020-05-14 DIAGNOSIS — F411 Generalized anxiety disorder: Secondary | ICD-10-CM | POA: Diagnosis not present

## 2020-05-14 DIAGNOSIS — F331 Major depressive disorder, recurrent, moderate: Secondary | ICD-10-CM | POA: Diagnosis not present

## 2020-05-20 DIAGNOSIS — M9903 Segmental and somatic dysfunction of lumbar region: Secondary | ICD-10-CM | POA: Diagnosis not present

## 2020-05-20 DIAGNOSIS — M9905 Segmental and somatic dysfunction of pelvic region: Secondary | ICD-10-CM | POA: Diagnosis not present

## 2020-05-20 DIAGNOSIS — M5136 Other intervertebral disc degeneration, lumbar region: Secondary | ICD-10-CM | POA: Diagnosis not present

## 2020-05-20 DIAGNOSIS — M9904 Segmental and somatic dysfunction of sacral region: Secondary | ICD-10-CM | POA: Diagnosis not present

## 2020-05-26 DIAGNOSIS — M9903 Segmental and somatic dysfunction of lumbar region: Secondary | ICD-10-CM | POA: Diagnosis not present

## 2020-05-26 DIAGNOSIS — M9905 Segmental and somatic dysfunction of pelvic region: Secondary | ICD-10-CM | POA: Diagnosis not present

## 2020-05-26 DIAGNOSIS — M9904 Segmental and somatic dysfunction of sacral region: Secondary | ICD-10-CM | POA: Diagnosis not present

## 2020-05-26 DIAGNOSIS — M5136 Other intervertebral disc degeneration, lumbar region: Secondary | ICD-10-CM | POA: Diagnosis not present

## 2020-06-02 DIAGNOSIS — M9903 Segmental and somatic dysfunction of lumbar region: Secondary | ICD-10-CM | POA: Diagnosis not present

## 2020-06-02 DIAGNOSIS — M9905 Segmental and somatic dysfunction of pelvic region: Secondary | ICD-10-CM | POA: Diagnosis not present

## 2020-06-02 DIAGNOSIS — M5136 Other intervertebral disc degeneration, lumbar region: Secondary | ICD-10-CM | POA: Diagnosis not present

## 2020-06-02 DIAGNOSIS — M9904 Segmental and somatic dysfunction of sacral region: Secondary | ICD-10-CM | POA: Diagnosis not present

## 2020-06-11 DIAGNOSIS — M9904 Segmental and somatic dysfunction of sacral region: Secondary | ICD-10-CM | POA: Diagnosis not present

## 2020-06-11 DIAGNOSIS — M9903 Segmental and somatic dysfunction of lumbar region: Secondary | ICD-10-CM | POA: Diagnosis not present

## 2020-06-11 DIAGNOSIS — M9905 Segmental and somatic dysfunction of pelvic region: Secondary | ICD-10-CM | POA: Diagnosis not present

## 2020-06-11 DIAGNOSIS — M5136 Other intervertebral disc degeneration, lumbar region: Secondary | ICD-10-CM | POA: Diagnosis not present

## 2020-07-07 DIAGNOSIS — M9905 Segmental and somatic dysfunction of pelvic region: Secondary | ICD-10-CM | POA: Diagnosis not present

## 2020-07-07 DIAGNOSIS — M9904 Segmental and somatic dysfunction of sacral region: Secondary | ICD-10-CM | POA: Diagnosis not present

## 2020-07-07 DIAGNOSIS — M5136 Other intervertebral disc degeneration, lumbar region: Secondary | ICD-10-CM | POA: Diagnosis not present

## 2020-07-07 DIAGNOSIS — M9903 Segmental and somatic dysfunction of lumbar region: Secondary | ICD-10-CM | POA: Diagnosis not present

## 2020-09-29 DIAGNOSIS — M9903 Segmental and somatic dysfunction of lumbar region: Secondary | ICD-10-CM | POA: Diagnosis not present

## 2020-09-29 DIAGNOSIS — M5136 Other intervertebral disc degeneration, lumbar region: Secondary | ICD-10-CM | POA: Diagnosis not present

## 2020-09-29 DIAGNOSIS — M9904 Segmental and somatic dysfunction of sacral region: Secondary | ICD-10-CM | POA: Diagnosis not present

## 2020-09-29 DIAGNOSIS — M9905 Segmental and somatic dysfunction of pelvic region: Secondary | ICD-10-CM | POA: Diagnosis not present

## 2020-10-06 DIAGNOSIS — M9905 Segmental and somatic dysfunction of pelvic region: Secondary | ICD-10-CM | POA: Diagnosis not present

## 2020-10-06 DIAGNOSIS — M9903 Segmental and somatic dysfunction of lumbar region: Secondary | ICD-10-CM | POA: Diagnosis not present

## 2020-10-06 DIAGNOSIS — M5136 Other intervertebral disc degeneration, lumbar region: Secondary | ICD-10-CM | POA: Diagnosis not present

## 2020-10-06 DIAGNOSIS — M9904 Segmental and somatic dysfunction of sacral region: Secondary | ICD-10-CM | POA: Diagnosis not present

## 2020-10-13 DIAGNOSIS — M9904 Segmental and somatic dysfunction of sacral region: Secondary | ICD-10-CM | POA: Diagnosis not present

## 2020-10-13 DIAGNOSIS — M9905 Segmental and somatic dysfunction of pelvic region: Secondary | ICD-10-CM | POA: Diagnosis not present

## 2020-10-13 DIAGNOSIS — M9903 Segmental and somatic dysfunction of lumbar region: Secondary | ICD-10-CM | POA: Diagnosis not present

## 2020-10-13 DIAGNOSIS — M5136 Other intervertebral disc degeneration, lumbar region: Secondary | ICD-10-CM | POA: Diagnosis not present

## 2020-10-20 DIAGNOSIS — M9903 Segmental and somatic dysfunction of lumbar region: Secondary | ICD-10-CM | POA: Diagnosis not present

## 2020-10-20 DIAGNOSIS — M9905 Segmental and somatic dysfunction of pelvic region: Secondary | ICD-10-CM | POA: Diagnosis not present

## 2020-10-20 DIAGNOSIS — M5136 Other intervertebral disc degeneration, lumbar region: Secondary | ICD-10-CM | POA: Diagnosis not present

## 2020-10-20 DIAGNOSIS — M9904 Segmental and somatic dysfunction of sacral region: Secondary | ICD-10-CM | POA: Diagnosis not present

## 2020-11-19 DIAGNOSIS — R410 Disorientation, unspecified: Secondary | ICD-10-CM | POA: Diagnosis not present

## 2020-11-19 DIAGNOSIS — R350 Frequency of micturition: Secondary | ICD-10-CM | POA: Diagnosis not present

## 2020-11-19 DIAGNOSIS — R5383 Other fatigue: Secondary | ICD-10-CM | POA: Diagnosis not present

## 2020-11-19 DIAGNOSIS — E039 Hypothyroidism, unspecified: Secondary | ICD-10-CM | POA: Diagnosis not present

## 2020-12-08 DIAGNOSIS — M9903 Segmental and somatic dysfunction of lumbar region: Secondary | ICD-10-CM | POA: Diagnosis not present

## 2020-12-08 DIAGNOSIS — M5136 Other intervertebral disc degeneration, lumbar region: Secondary | ICD-10-CM | POA: Diagnosis not present

## 2020-12-08 DIAGNOSIS — M9904 Segmental and somatic dysfunction of sacral region: Secondary | ICD-10-CM | POA: Diagnosis not present

## 2020-12-08 DIAGNOSIS — M9905 Segmental and somatic dysfunction of pelvic region: Secondary | ICD-10-CM | POA: Diagnosis not present

## 2021-03-18 DIAGNOSIS — M9904 Segmental and somatic dysfunction of sacral region: Secondary | ICD-10-CM | POA: Diagnosis not present

## 2021-03-18 DIAGNOSIS — M9903 Segmental and somatic dysfunction of lumbar region: Secondary | ICD-10-CM | POA: Diagnosis not present

## 2021-03-18 DIAGNOSIS — M9905 Segmental and somatic dysfunction of pelvic region: Secondary | ICD-10-CM | POA: Diagnosis not present

## 2021-03-18 DIAGNOSIS — M5136 Other intervertebral disc degeneration, lumbar region: Secondary | ICD-10-CM | POA: Diagnosis not present

## 2021-03-22 DIAGNOSIS — M9904 Segmental and somatic dysfunction of sacral region: Secondary | ICD-10-CM | POA: Diagnosis not present

## 2021-03-22 DIAGNOSIS — M9903 Segmental and somatic dysfunction of lumbar region: Secondary | ICD-10-CM | POA: Diagnosis not present

## 2021-03-22 DIAGNOSIS — M9905 Segmental and somatic dysfunction of pelvic region: Secondary | ICD-10-CM | POA: Diagnosis not present

## 2021-03-22 DIAGNOSIS — M5136 Other intervertebral disc degeneration, lumbar region: Secondary | ICD-10-CM | POA: Diagnosis not present

## 2021-03-31 DIAGNOSIS — M5136 Other intervertebral disc degeneration, lumbar region: Secondary | ICD-10-CM | POA: Diagnosis not present

## 2021-03-31 DIAGNOSIS — M9905 Segmental and somatic dysfunction of pelvic region: Secondary | ICD-10-CM | POA: Diagnosis not present

## 2021-03-31 DIAGNOSIS — M9903 Segmental and somatic dysfunction of lumbar region: Secondary | ICD-10-CM | POA: Diagnosis not present

## 2021-03-31 DIAGNOSIS — M9904 Segmental and somatic dysfunction of sacral region: Secondary | ICD-10-CM | POA: Diagnosis not present

## 2021-04-06 DIAGNOSIS — M9905 Segmental and somatic dysfunction of pelvic region: Secondary | ICD-10-CM | POA: Diagnosis not present

## 2021-04-06 DIAGNOSIS — M9904 Segmental and somatic dysfunction of sacral region: Secondary | ICD-10-CM | POA: Diagnosis not present

## 2021-04-06 DIAGNOSIS — M9903 Segmental and somatic dysfunction of lumbar region: Secondary | ICD-10-CM | POA: Diagnosis not present

## 2021-04-06 DIAGNOSIS — M5136 Other intervertebral disc degeneration, lumbar region: Secondary | ICD-10-CM | POA: Diagnosis not present

## 2021-04-15 DIAGNOSIS — M9904 Segmental and somatic dysfunction of sacral region: Secondary | ICD-10-CM | POA: Diagnosis not present

## 2021-04-15 DIAGNOSIS — M5136 Other intervertebral disc degeneration, lumbar region: Secondary | ICD-10-CM | POA: Diagnosis not present

## 2021-04-15 DIAGNOSIS — M9903 Segmental and somatic dysfunction of lumbar region: Secondary | ICD-10-CM | POA: Diagnosis not present

## 2021-04-15 DIAGNOSIS — M9905 Segmental and somatic dysfunction of pelvic region: Secondary | ICD-10-CM | POA: Diagnosis not present

## 2021-04-20 DIAGNOSIS — M9905 Segmental and somatic dysfunction of pelvic region: Secondary | ICD-10-CM | POA: Diagnosis not present

## 2021-04-20 DIAGNOSIS — M9903 Segmental and somatic dysfunction of lumbar region: Secondary | ICD-10-CM | POA: Diagnosis not present

## 2021-04-20 DIAGNOSIS — M5136 Other intervertebral disc degeneration, lumbar region: Secondary | ICD-10-CM | POA: Diagnosis not present

## 2021-04-20 DIAGNOSIS — M9904 Segmental and somatic dysfunction of sacral region: Secondary | ICD-10-CM | POA: Diagnosis not present

## 2021-07-19 DIAGNOSIS — M9904 Segmental and somatic dysfunction of sacral region: Secondary | ICD-10-CM | POA: Diagnosis not present

## 2021-07-19 DIAGNOSIS — M9905 Segmental and somatic dysfunction of pelvic region: Secondary | ICD-10-CM | POA: Diagnosis not present

## 2021-07-19 DIAGNOSIS — M9903 Segmental and somatic dysfunction of lumbar region: Secondary | ICD-10-CM | POA: Diagnosis not present

## 2021-07-19 DIAGNOSIS — M5136 Other intervertebral disc degeneration, lumbar region: Secondary | ICD-10-CM | POA: Diagnosis not present

## 2021-10-13 DIAGNOSIS — M9903 Segmental and somatic dysfunction of lumbar region: Secondary | ICD-10-CM | POA: Diagnosis not present

## 2021-10-13 DIAGNOSIS — M9905 Segmental and somatic dysfunction of pelvic region: Secondary | ICD-10-CM | POA: Diagnosis not present

## 2021-10-13 DIAGNOSIS — M9904 Segmental and somatic dysfunction of sacral region: Secondary | ICD-10-CM | POA: Diagnosis not present

## 2021-10-13 DIAGNOSIS — M5136 Other intervertebral disc degeneration, lumbar region: Secondary | ICD-10-CM | POA: Diagnosis not present

## 2021-10-28 DIAGNOSIS — H61002 Unspecified perichondritis of left external ear: Secondary | ICD-10-CM | POA: Diagnosis not present

## 2022-02-17 DIAGNOSIS — M9903 Segmental and somatic dysfunction of lumbar region: Secondary | ICD-10-CM | POA: Diagnosis not present

## 2022-02-17 DIAGNOSIS — M5136 Other intervertebral disc degeneration, lumbar region: Secondary | ICD-10-CM | POA: Diagnosis not present

## 2022-02-17 DIAGNOSIS — M9905 Segmental and somatic dysfunction of pelvic region: Secondary | ICD-10-CM | POA: Diagnosis not present

## 2022-02-17 DIAGNOSIS — M9904 Segmental and somatic dysfunction of sacral region: Secondary | ICD-10-CM | POA: Diagnosis not present

## 2022-02-22 DIAGNOSIS — M9905 Segmental and somatic dysfunction of pelvic region: Secondary | ICD-10-CM | POA: Diagnosis not present

## 2022-02-22 DIAGNOSIS — M5136 Other intervertebral disc degeneration, lumbar region: Secondary | ICD-10-CM | POA: Diagnosis not present

## 2022-02-22 DIAGNOSIS — M9904 Segmental and somatic dysfunction of sacral region: Secondary | ICD-10-CM | POA: Diagnosis not present

## 2022-02-22 DIAGNOSIS — M9903 Segmental and somatic dysfunction of lumbar region: Secondary | ICD-10-CM | POA: Diagnosis not present

## 2022-03-08 DIAGNOSIS — M9903 Segmental and somatic dysfunction of lumbar region: Secondary | ICD-10-CM | POA: Diagnosis not present

## 2022-03-08 DIAGNOSIS — M9905 Segmental and somatic dysfunction of pelvic region: Secondary | ICD-10-CM | POA: Diagnosis not present

## 2022-03-08 DIAGNOSIS — M5136 Other intervertebral disc degeneration, lumbar region: Secondary | ICD-10-CM | POA: Diagnosis not present

## 2022-03-08 DIAGNOSIS — M9904 Segmental and somatic dysfunction of sacral region: Secondary | ICD-10-CM | POA: Diagnosis not present

## 2022-03-10 DIAGNOSIS — M9905 Segmental and somatic dysfunction of pelvic region: Secondary | ICD-10-CM | POA: Diagnosis not present

## 2022-03-10 DIAGNOSIS — M9904 Segmental and somatic dysfunction of sacral region: Secondary | ICD-10-CM | POA: Diagnosis not present

## 2022-03-10 DIAGNOSIS — M5136 Other intervertebral disc degeneration, lumbar region: Secondary | ICD-10-CM | POA: Diagnosis not present

## 2022-03-10 DIAGNOSIS — M9903 Segmental and somatic dysfunction of lumbar region: Secondary | ICD-10-CM | POA: Diagnosis not present

## 2022-06-17 ENCOUNTER — Telehealth: Payer: Self-pay

## 2022-06-17 NOTE — Patient Outreach (Signed)
  Care Coordination   06/17/2022 Name: [name] MRN: [id number] DOB: 09/24/1940   Care Coordination Outreach Attempts:  An unsuccessful telephone outreach was attempted today to offer the patient information about available care coordination services as a benefit of their health plan.   Follow Up Plan:  Additional outreach attempts will be made to offer the patient care coordination information and services.   Encounter Outcome:  No Answer  Care Coordination Interventions Activated:  No   Care Coordination Interventions:  No, not indicated    Enzo Montgomery, RN,BSN,CCM [place] [facility] Telephonic Care Management Coordinator Direct Phone: [phone] Toll Free: [phone] Fax: [phone]

## 2022-06-22 ENCOUNTER — Telehealth: Payer: Self-pay

## 2022-06-22 NOTE — Patient Outreach (Signed)
  Care Coordination   06/22/2022 Name: [name] MRN: [id number] DOB: 1941-07-27   Care Coordination Outreach Attempts:  A second unsuccessful outreach was attempted today to offer the patient with information about available care coordination services as a benefit of their health plan.     Follow Up Plan:  Additional outreach attempts will be made to offer the patient care coordination information and services.   Encounter Outcome:  No Answer  Care Coordination Interventions Activated:  No   Care Coordination Interventions:  No, not indicated    Enzo Montgomery, RN,BSN,CCM [place] [facility] Telephonic Care Management Coordinator Direct Phone: [phone] Toll Free: [phone] Fax: [phone]

## 2022-07-28 DIAGNOSIS — M9904 Segmental and somatic dysfunction of sacral region: Secondary | ICD-10-CM | POA: Diagnosis not present

## 2022-07-28 DIAGNOSIS — M5136 Other intervertebral disc degeneration, lumbar region: Secondary | ICD-10-CM | POA: Diagnosis not present

## 2022-07-28 DIAGNOSIS — M9905 Segmental and somatic dysfunction of pelvic region: Secondary | ICD-10-CM | POA: Diagnosis not present

## 2022-07-28 DIAGNOSIS — M9903 Segmental and somatic dysfunction of lumbar region: Secondary | ICD-10-CM | POA: Diagnosis not present

## 2022-09-22 DIAGNOSIS — M9905 Segmental and somatic dysfunction of pelvic region: Secondary | ICD-10-CM | POA: Diagnosis not present

## 2022-09-22 DIAGNOSIS — M9904 Segmental and somatic dysfunction of sacral region: Secondary | ICD-10-CM | POA: Diagnosis not present

## 2022-09-22 DIAGNOSIS — M9903 Segmental and somatic dysfunction of lumbar region: Secondary | ICD-10-CM | POA: Diagnosis not present

## 2022-09-22 DIAGNOSIS — M5136 Other intervertebral disc degeneration, lumbar region: Secondary | ICD-10-CM | POA: Diagnosis not present

## 2023-03-20 DIAGNOSIS — M5136 Other intervertebral disc degeneration, lumbar region: Secondary | ICD-10-CM | POA: Diagnosis not present

## 2023-03-20 DIAGNOSIS — M9905 Segmental and somatic dysfunction of pelvic region: Secondary | ICD-10-CM | POA: Diagnosis not present

## 2023-03-20 DIAGNOSIS — M9903 Segmental and somatic dysfunction of lumbar region: Secondary | ICD-10-CM | POA: Diagnosis not present

## 2023-03-20 DIAGNOSIS — M9904 Segmental and somatic dysfunction of sacral region: Secondary | ICD-10-CM | POA: Diagnosis not present
